# Patient Record
Sex: Female | Born: 1970 | Race: White | Hispanic: Yes | Marital: Married | Smoking: Never smoker
Health system: Southern US, Community
[De-identification: ages and names within clinical notes are randomized; demographics above are authoritative.]

## PROBLEM LIST (undated history)

## (undated) DIAGNOSIS — E039 Hypothyroidism, unspecified: Secondary | ICD-10-CM

## (undated) DIAGNOSIS — R002 Palpitations: Secondary | ICD-10-CM

## (undated) DIAGNOSIS — E0500 Thyrotoxicosis with diffuse goiter without thyrotoxic crisis or storm: Secondary | ICD-10-CM

## (undated) HISTORY — DX: Hypothyroidism, unspecified: E03.9

## (undated) HISTORY — DX: Palpitations: R00.2

## (undated) HISTORY — PX: SEPTOPLASTY: SUR1290

## (undated) HISTORY — DX: Thyrotoxicosis with diffuse goiter without thyrotoxic crisis or storm: E05.00

---

## 2017-10-04 ENCOUNTER — Other Ambulatory Visit: Payer: Self-pay | Admitting: Obstetrics & Gynecology

## 2017-10-04 DIAGNOSIS — Z1231 Encounter for screening mammogram for malignant neoplasm of breast: Secondary | ICD-10-CM

## 2017-10-26 ENCOUNTER — Ambulatory Visit
Admission: RE | Admit: 2017-10-26 | Discharge: 2017-10-26 | Disposition: A | Discharge status: Home / Self Care | Payer: Commercial Managed Care - PPO | Source: Ambulatory Visit | Attending: Obstetrics & Gynecology | Admitting: Obstetrics & Gynecology

## 2017-10-26 ENCOUNTER — Other Ambulatory Visit: Payer: Self-pay | Admitting: Obstetrics & Gynecology

## 2017-10-26 DIAGNOSIS — Z1231 Encounter for screening mammogram for malignant neoplasm of breast: Secondary | ICD-10-CM

## 2018-06-27 DIAGNOSIS — E039 Hypothyroidism, unspecified: Secondary | ICD-10-CM | POA: Diagnosis not present

## 2018-06-27 DIAGNOSIS — R05 Cough: Secondary | ICD-10-CM | POA: Diagnosis not present

## 2018-09-03 DIAGNOSIS — L718 Other rosacea: Secondary | ICD-10-CM | POA: Diagnosis not present

## 2018-10-17 DIAGNOSIS — R42 Dizziness and giddiness: Secondary | ICD-10-CM | POA: Diagnosis not present

## 2019-06-04 ENCOUNTER — Other Ambulatory Visit: Payer: Self-pay

## 2019-06-04 ENCOUNTER — Ambulatory Visit (INDEPENDENT_AMBULATORY_CARE_PROVIDER_SITE_OTHER): Payer: Commercial Managed Care - PPO | Admitting: Family Medicine

## 2019-06-04 ENCOUNTER — Encounter: Payer: Self-pay | Admitting: Family Medicine

## 2019-06-04 DIAGNOSIS — M25522 Pain in left elbow: Secondary | ICD-10-CM

## 2019-06-04 MED ORDER — PREDNISONE 10 MG PO TABS: ORAL_TABLET | ORAL | 0 refills | Status: DC

## 2019-06-04 NOTE — Progress Notes (Signed)
   Office Visit Note   Patient: Yolanda Hall           Date of Birth: Dec 05, 1970           MRN: [id number] Visit Date: 06/04/2019 Requested by: No referring provider defined for this encounter. PCP: Yolanda Pepper, MD  Subjective: Chief Complaint  Patient presents with  . Left Elbow - Pain    Pain x 3 weeks, at least. NKI. Tried TENS unit - helped a little. Pain medial and lateral, especially with certain movements. Right hand dominant.    HPI: She is here with left elbow pain.  She is right-hand dominant.  She cannot recall an injury, but about a month ago she started noticing pain in her elbow in the morning when she woke up.  Pain on the medial and lateral aspect.  Pain gets better after she loosens up.  She has tried a TENS unit, massage, and a variety of stretches and exercises but the pain does not seem to be going away.  Denies any numbness, denies any swelling or erythema.  She has a history of arthritis in her knees, and has Graves' disease and one other autoimmune disorder.  No history of rheumatoid arthritis or gout.  Incidentally, she and her family moved here from [facility] couple years ago.  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              ROS: No fever or chills.  All other systems were reviewed and are negative.  Objective: Vital Signs: There were no vitals taken for this visit.  Physical Exam:  General:  Alert and oriented, in no acute distress. Pulm:  Breathing unlabored. Psy:  Normal mood, congruent affect. Skin: No rash or erythema. Left elbow: Full active range of motion compared to the right, no effusion or warmth.  He is tender at the common extensor tendon at the lateral epicondyle and a little bit on the medial epicondyle.  No tenderness over the ulnar nerve.  Pain with wrist extension and forearm pronation and supination against resistance.  Imaging: None today  Assessment & Plan: 1.  Left elbow pain with medial and lateral  epicondylitis.  Cannot rule out rheumatologic process. -Prednisone taper, continue with home exercises.  If symptoms persist, x-rays and possibly rheumatoid labs.    Procedures: No procedures performed  No notes on file     PMFS History: There are no problems to display for this patient.  History reviewed. No pertinent past medical history.  History reviewed. No pertinent family history.  History reviewed. No pertinent surgical history. Social History   Occupational History  . Not on file  Tobacco Use  . Smoking status: Not on file  Substance and Sexual Activity  . Alcohol use: Not on file  . Drug use: Not on file  . Sexual activity: Not on file

## 2019-06-16 ENCOUNTER — Other Ambulatory Visit: Payer: Self-pay | Admitting: Obstetrics & Gynecology

## 2019-06-16 DIAGNOSIS — Z1231 Encounter for screening mammogram for malignant neoplasm of breast: Secondary | ICD-10-CM

## 2019-06-17 ENCOUNTER — Ambulatory Visit (INDEPENDENT_AMBULATORY_CARE_PROVIDER_SITE_OTHER): Payer: Commercial Managed Care - PPO | Admitting: Family Medicine

## 2019-06-17 ENCOUNTER — Other Ambulatory Visit: Payer: Self-pay

## 2019-06-17 ENCOUNTER — Encounter: Payer: Self-pay | Admitting: Family Medicine

## 2019-06-17 DIAGNOSIS — M25521 Pain in right elbow: Secondary | ICD-10-CM | POA: Diagnosis not present

## 2019-06-17 DIAGNOSIS — M79671 Pain in right foot: Secondary | ICD-10-CM | POA: Diagnosis not present

## 2019-06-17 DIAGNOSIS — M25561 Pain in right knee: Secondary | ICD-10-CM

## 2019-06-17 DIAGNOSIS — R768 Other specified abnormal immunological findings in serum: Secondary | ICD-10-CM

## 2019-06-17 DIAGNOSIS — M25522 Pain in left elbow: Secondary | ICD-10-CM | POA: Diagnosis not present

## 2019-06-17 DIAGNOSIS — M1712 Unilateral primary osteoarthritis, left knee: Secondary | ICD-10-CM

## 2019-06-17 DIAGNOSIS — M25562 Pain in left knee: Secondary | ICD-10-CM

## 2019-06-17 DIAGNOSIS — M79672 Pain in left foot: Secondary | ICD-10-CM

## 2019-06-17 DIAGNOSIS — M1711 Unilateral primary osteoarthritis, right knee: Secondary | ICD-10-CM

## 2019-06-17 NOTE — Progress Notes (Signed)
I saw and examined the patient with Dr. Robby Sermon and agree with assessment and plan as outlined.    Prednisone did not help left elbow, and now both elbows hurt along with both hands, feet and knees.  Will draw arthritis panel labs, possibly referral to Dr. Dierdre Forth depending on results.  Knees are known to have osteoarthritis.  She would like to get approval for bilateral gel injections.

## 2019-06-17 NOTE — Progress Notes (Signed)
Office Visit Note   Patient: Yolanda Hall           Date of Birth: 1970/10/23           MRN: [id number] Visit Date: 06/17/2019 Requested by: London Pepper, MD [address],  [place] [address] PCP: London Pepper, MD  Subjective: Chief Complaint  Patient presents with  . Left Elbow - Pain, Follow-up    Pain is no better post prednisone taper. Having pain in the right elbow as well now.    HPI: She is here for follow up of left elbow pain, now presenting with bilateral elbow pain as well as pain in hands, knees, feet.  Elbows- started with left elbow. When she started taking prednisone on 12/24, she started to have pain in right elbow. Pain on medial and lateral aspect, pain with supination and pronation.   Hands: pain in DIP and PIP joints on both hands. They were very tender when she was taking prednisone. They feel stiff, especially in the cold. Pain gets better after she moves them/loosens up. Occasional swelling. No erythema.  Knees- history of OA in both knees. She has had corticosteroid injections in the past with minimal relief. Feels popping and crepitus. Still running although it is painful.  Pain on the medial and lateral aspect.  Pain gets better after she loosens up.  She has tried a TENS unit, massage, and a variety of stretches and exercises but the pain does not seem to be going away.    Feet- pain in both feet, has been a chronic issue but they have been more painful recently.   Has Graves' disease and one other autoimmune disorder.     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              ROS: No fever or chills.  All other systems were reviewed and are negative.  Objective: Vital Signs: There were no vitals taken for this visit.  Physical Exam:  General:  Alert and oriented, in no acute distress. Pulm:  Breathing unlabored. Psy:  Normal mood, congruent affect. Skin: No rash or erythema. Elbows: Full active range  of motion in both elbows  no effusion or warmth. TTP over medial and lateral epicondyles. Pain with wrist extension and forearm pronation and supination against resistance.  Knees: no effusion. Crepitus and popping bilaterally. TTP medial to patella. No ligamentous laxity.  Hands: no specific swelling of DIP or PIP joints observed. Full ROM in hands and fingers.   Imaging: None today  Assessment & Plan: Given bilateral elbow pain, hand pain and stiffness, along with chronic knee and foot pain, rheumatologic workup warranted. Will order CRP, ESR, ANA, anti- CCP, rheumatoid factor, and vitamin D levels. If any of these are abnormal, will refer to rheumatology. For chronic knee pain with known OA that has not responded to corticosteroid injections, will obtain prior authorization for gel injections bilaterally.    Procedures: None today    PMFS History: There are no problems to display for this patient.  No past medical history on file.  No family history on file.  No past surgical history on file. Social History   Occupational History  . Not on file  Tobacco Use  . Smoking status: Not on file  Substance and Sexual Activity  . Alcohol use: Not on file  . Drug use: Not on file  . Sexual activity: Not on file

## 2019-06-18 NOTE — Progress Notes (Signed)
Noted  

## 2019-06-19 ENCOUNTER — Telehealth: Payer: Self-pay | Admitting: Family Medicine

## 2019-06-19 LAB — SEDIMENTATION RATE: Sed Rate: 2 mm/h (ref 0–20)

## 2019-06-19 LAB — ANTI-NUCLEAR AB-TITER (ANA TITER)
ANA TITER: 1:80 {titer} — ABNORMAL HIGH
ANA Titer 1: 1:80 {titer} — ABNORMAL HIGH

## 2019-06-19 LAB — ANA: Anti Nuclear Antibody (ANA): POSITIVE — AB

## 2019-06-19 LAB — C-REACTIVE PROTEIN: CRP: 0.6 mg/L (ref <8.0)

## 2019-06-19 LAB — VITAMIN D 25 HYDROXY (VIT D DEFICIENCY, FRACTURES): Vit D, 25-Hydroxy: 24 ng/mL — ABNORMAL LOW (ref 30–100)

## 2019-06-19 LAB — CYCLIC CITRUL PEPTIDE ANTIBODY, IGG: Cyclic Citrullin Peptide Ab: <16 UNITS

## 2019-06-19 LAB — RHEUMATOID FACTOR: Rheumatoid fact SerPl-aCnc: <14 IU/mL (ref <14)

## 2019-06-19 NOTE — Telephone Encounter (Signed)
Labs are notable for the following:  ANA is positive with a fairly low titer of 1: 80.  This is due to autoimmune disease of some sort, possibly Graves' disease but potentially something else.  Vitamin D is low at 24.  We want this to be 50-80.  I recommend taking vitamin D3 at 5000 IU daily long-term.  Other results were normal/negative.

## 2019-06-20 ENCOUNTER — Encounter: Payer: Self-pay | Admitting: Family Medicine

## 2019-06-20 ENCOUNTER — Ambulatory Visit
Admission: RE | Admit: 2019-06-20 | Discharge: 2019-06-20 | Disposition: A | Discharge status: Home / Self Care | Payer: Commercial Managed Care - PPO | Source: Ambulatory Visit | Attending: Obstetrics & Gynecology | Admitting: Obstetrics & Gynecology

## 2019-06-20 ENCOUNTER — Other Ambulatory Visit: Payer: Self-pay

## 2019-06-20 ENCOUNTER — Other Ambulatory Visit: Payer: Self-pay | Admitting: Obstetrics & Gynecology

## 2019-06-20 DIAGNOSIS — Z1231 Encounter for screening mammogram for malignant neoplasm of breast: Secondary | ICD-10-CM

## 2019-06-20 NOTE — Addendum Note (Signed)
Addended by: Lillia Carmel on: 06/20/2019 07:55 AM   Modules accepted: Orders

## 2019-06-24 ENCOUNTER — Telehealth: Payer: Self-pay

## 2019-06-24 NOTE — Telephone Encounter (Signed)
Submitted VOB for Durolane, bilateral knee. °

## 2019-06-26 ENCOUNTER — Encounter: Payer: Self-pay | Admitting: Family Medicine

## 2019-06-26 ENCOUNTER — Telehealth: Payer: Self-pay

## 2019-06-26 NOTE — Telephone Encounter (Signed)
Talked with patient concerning correct policy# for [facility] insurance. Talked with Greggory Stallion with [phone] and provided him with the correct policy# and phone number.

## 2019-07-01 ENCOUNTER — Telehealth: Payer: Self-pay

## 2019-07-01 NOTE — Telephone Encounter (Signed)
Patient aware that she is approved for gel injection.  Approved for Durolane, bilateral knee. Buy & Bill Covered at 100% of the allowable amount Co-pay of $50.00 No PA required  Appt. 07/04/2019 with Dr. Prince Rome

## 2019-07-04 ENCOUNTER — Ambulatory Visit: Payer: Commercial Managed Care - PPO | Admitting: Family Medicine

## 2019-07-04 ENCOUNTER — Ambulatory Visit: Payer: Self-pay

## 2019-07-04 ENCOUNTER — Encounter: Payer: Self-pay | Admitting: Family Medicine

## 2019-07-04 ENCOUNTER — Other Ambulatory Visit: Payer: Self-pay

## 2019-07-04 DIAGNOSIS — M1711 Unilateral primary osteoarthritis, right knee: Secondary | ICD-10-CM | POA: Diagnosis not present

## 2019-07-04 DIAGNOSIS — M1712 Unilateral primary osteoarthritis, left knee: Secondary | ICD-10-CM

## 2019-07-04 NOTE — Progress Notes (Signed)
   Office Visit Note   Patient: Auset Fritzler           Date of Birth: July 24, 1970           MRN: [id number] Visit Date: 07/04/2019 Requested by: Farris Has, MD [address] [place],  [place] [address] PCP: Farris Has, MD  Subjective: Chief Complaint  Patient presents with  . bilateral knee Durolane injections    HPI: She is here for planned bilateral Durolane injections for knee osteoarthritis.  She is scheduled to meet with rheumatologist next week.  Her elbow is still bothering her quite a bit.              ROS: No fevers or chills.  All other systems were reviewed and are negative.  Objective: Vital Signs: There were no vitals taken for this visit.  Physical Exam:  General:  Alert and oriented, in no acute distress. Pulm:  Breathing unlabored. Psy:  Normal mood, congruent affect. Skin: No erythema or rash. Knees: Trace effusion bilaterally with no warmth.  Imaging: None other than for needle guidance  Assessment & Plan: 1.  Bilateral knee osteoarthritis -Ultrasound-guided Durolane injections today.  Follow-up as needed.     Procedures: Bilateral knee injections: Ultrasound was used to ensure intra-articular placement.  After sterile prep with Betadine, injected 3 cc 1% lidocaine without epinephrine and durolane from lateral approach into the right knee and medial approach into the left knee guiding the needle into the joint recess.  No immediate complications.    PMFS History: There are no problems to display for this patient.  History reviewed. No pertinent past medical history.  History reviewed. No pertinent family history.  History reviewed. No pertinent surgical history. Social History   Occupational History  . Not on file  Tobacco Use  . Smoking status: Not on file  Substance and Sexual Activity  . Alcohol use: Not on file  . Drug use: Not on file  . Sexual activity: Not on file

## 2019-09-17 ENCOUNTER — Telehealth: Payer: Self-pay | Admitting: Internal Medicine

## 2019-09-17 NOTE — Telephone Encounter (Signed)
ROI faxed to Dr. Morrow/[facility] 4/7/21fbg  

## 2019-09-25 ENCOUNTER — Other Ambulatory Visit: Payer: Self-pay

## 2019-09-26 ENCOUNTER — Encounter: Payer: Self-pay | Admitting: Internal Medicine

## 2019-09-26 ENCOUNTER — Ambulatory Visit: Payer: Commercial Managed Care - PPO | Admitting: Internal Medicine

## 2019-09-26 DIAGNOSIS — E0500 Thyrotoxicosis with diffuse goiter without thyrotoxic crisis or storm: Secondary | ICD-10-CM | POA: Insufficient documentation

## 2019-09-26 DIAGNOSIS — E039 Hypothyroidism, unspecified: Secondary | ICD-10-CM | POA: Insufficient documentation

## 2019-09-26 DIAGNOSIS — E890 Postprocedural hypothyroidism: Secondary | ICD-10-CM | POA: Diagnosis not present

## 2019-09-26 NOTE — Patient Instructions (Signed)
-  Nice seeing you today!!  -Schedule follow up in early fall for your physical. Please come in fasting for lab work.

## 2019-09-26 NOTE — Progress Notes (Signed)
New Patient Office Visit     This visit occurred during the SARS-CoV-2 public health emergency.  Safety protocols were in place, including screening questions prior to the visit, additional usage of staff PPE, and extensive cleaning of exam room while observing appropriate contact time as indicated for disinfecting solutions.    CC/Reason for Visit: Establish care, discuss chronic medical conditions Previous PCP: Farris Has, MD with [facility] Last Visit: Unclear  HPI: Yolanda Hall is a 49 y.o. female who is coming in today for the above mentioned reasons. Past Medical History is significant for: Hypothyroidism on Armour Thyroid followed by [facility] this is following radioactive iodine ablation for Graves' disease.  She also has a history of dermatitis purpura not currently on active treatment.  She has no complaints today.  She is a Advice worker.  She moved to [place] in 2015.  She is married, has a daughter who is in the fifth grade, has no known drug allergies, her past surgical history significant for C-section in 2010 and a deviated septum repair in 2019, she does not smoke, she does not drink, she does not have any family history of significance.   Past Medical/Surgical History: Past Medical History:  Diagnosis Date  . Graves disease   . Hypothyroidism     Past Surgical History:  Procedure Laterality Date  . CESAREAN SECTION    . SEPTOPLASTY      Social History:  reports that she has never smoked. She has never used smokeless tobacco. She reports that she does not drink alcohol or use drugs.  Allergies: No Known Allergies  Family History:  History of heart disease, cancer, stroke that she is  Current Outpatient Medications:  .  ARMOUR THYROID 90 MG tablet, Take 90 mg by mouth daily., Disp: , Rfl:  .  fexofenadine (ALLEGRA) 180 MG tablet, Take 180 mg by mouth daily., Disp: , Rfl:  .  Multiple Vitamin (MULTIVITAMIN) tablet, Take 1 tablet by  mouth daily., Disp: , Rfl:  .  triamcinolone (NASACORT) 55 MCG/ACT AERO nasal inhaler, Place 2 sprays into the nose daily., Disp: , Rfl:   Review of Systems:  Constitutional: Denies fever, chills, diaphoresis, appetite change and fatigue.  HEENT: Denies photophobia, eye pain, redness, hearing loss, ear pain, congestion, sore throat, rhinorrhea, sneezing, mouth sores, trouble swallowing, neck pain, neck stiffness and tinnitus.   Respiratory: Denies SOB, DOE, cough, chest tightness,  and wheezing.   Cardiovascular: Denies chest pain, palpitations and leg swelling.  Gastrointestinal: Denies nausea, vomiting, abdominal pain, diarrhea, constipation, blood in stool and abdominal distention.  Genitourinary: Denies dysuria, urgency, frequency, hematuria, flank pain and difficulty urinating.  Endocrine: Denies: hot or cold intolerance, sweats, changes in hair or nails, polyuria, polydipsia. Musculoskeletal: Denies myalgias, back pain, joint swelling, arthralgias and gait problem.  Skin: Denies pallor, rash and wound.  Neurological: Denies dizziness, seizures, syncope, weakness, light-headedness, numbness and headaches.  Hematological: Denies adenopathy. Easy bruising, personal or family bleeding history  Psychiatric/Behavioral: Denies suicidal ideation, mood changes, confusion, nervousness, sleep disturbance and agitation    Physical Exam: Vitals:   09/26/19 0838  BP: 120/84  Pulse: 76  Temp: 97.8 F (36.6 C)  TempSrc: Temporal  SpO2: 95%  Weight: 160 lb 12.8 oz (72.9 kg)  Height: 5\' 5"  (1.651 m)   Body mass index is 26.76 kg/m.  Constitutional: NAD, calm, comfortable Eyes: PERRL, lids and conjunctivae normal ENMT: Mucous membranes are moist.  Respiratory: clear to auscultation bilaterally, no wheezing, no  crackles. Normal respiratory effort. No accessory muscle use.  Cardiovascular: Regular rate and rhythm, no murmurs / rubs / gallops. No extremity edema.  Neurologic: Grossly intact  and nonfocal Psychiatric: Normal judgment and insight. Alert and oriented x 3. Normal mood.    Impression and Plan:  Graves disease Postablative hypothyroidism -She is status post radioactive iodine ablation with iatrogenic hypothyroidism and now on Armour Thyroid. -She is followed for this by [facility] MD. -At last check her TSH was over suppressed so they are in the process of adjusting her dosage.  She will return early fall for her physical.    Patient Instructions  -Nice seeing you today!!  -Schedule follow up in early fall for your physical. Please come in fasting for lab work.      Lelon Frohlich, MD [facility]

## 2020-01-30 ENCOUNTER — Ambulatory Visit: Payer: Commercial Managed Care - PPO | Admitting: Family Medicine

## 2020-01-30 ENCOUNTER — Other Ambulatory Visit: Payer: Self-pay

## 2020-01-30 ENCOUNTER — Encounter: Payer: Self-pay | Admitting: Family Medicine

## 2020-01-30 VITALS — BP 124/60 | HR 105 | Temp 97.9°F | Wt 163.2 lb

## 2020-01-30 DIAGNOSIS — R300 Dysuria: Secondary | ICD-10-CM

## 2020-01-30 LAB — POCT URINALYSIS DIPSTICK
Bilirubin, UA: NEGATIVE
Blood, UA: NEGATIVE
Glucose, UA: NEGATIVE
Ketones, UA: POSITIVE
Leukocytes, UA: NEGATIVE
Nitrite, UA: NEGATIVE
Protein, UA: NEGATIVE
Spec Grav, UA: 1.02 (ref 1.010–1.025)
Urobilinogen, UA: 0.2 E.U./dL
pH, UA: 6 (ref 5.0–8.0)

## 2020-01-30 MED ORDER — SULFAMETHOXAZOLE-TRIMETHOPRIM 800-160 MG PO TABS: 1.0000 | ORAL_TABLET | Freq: Two times a day (BID) | ORAL | 0 refills | Status: DC

## 2020-01-30 MED ORDER — PHENAZOPYRIDINE HCL 200 MG PO TABS: 200.0000 mg | ORAL_TABLET | Freq: Three times a day (TID) | ORAL | 0 refills | Status: DC | PRN

## 2020-01-30 NOTE — Progress Notes (Signed)
   Subjective:    Patient ID: Yolanda Hall, female    DOB: [birth year], 49 y.o.   MRN: [id number]  HPI Here for UTI symptoms that have not responded to 2 antibiotics. Several weeks ago she began to have lower back pains, lower abdominal pains, urgency to urinate, and burning on urination. No nausea or fever. No vaginal DC. BMs have been normal. On 01-19-20 she was in [place] visiting family and she saw a medical clinic there for these symptoms. A urine culture grew 10,000-49,000 colonies of Enterococcus faecalis which was pan-sensitive. She was given 5 days of Cipro, but she did not improve. She was then given 5 days of Macrobid, but she still did not improve. Today she is having the same symptoms. She is drinking lots of water and some cranberry juice.    Review of Systems  Constitutional: Negative.   Respiratory: Negative.   Cardiovascular: Negative.   Gastrointestinal: Negative.   Genitourinary: Positive for dysuria, frequency and urgency. Negative for flank pain, hematuria, vaginal bleeding and vaginal discharge.       Objective:   Physical Exam Constitutional:      Appearance: Normal appearance. She is not ill-appearing.  Cardiovascular:     Rate and Rhythm: Normal rate and regular rhythm.     Pulses: Normal pulses.  Pulmonary:     Effort: Pulmonary effort is normal.     Breath sounds: Normal breath sounds.  Abdominal:     General: Abdomen is flat. Bowel sounds are normal. There is no distension.     Palpations: Abdomen is soft. There is no mass.     Tenderness: There is no abdominal tenderness. There is no right CVA tenderness, left CVA tenderness, guarding or rebound.     Hernia: No hernia is present.  Neurological:     Mental Status: She is alert.           Assessment & Plan:  Dysuria. The fact that she has not responded to 2 courses of antibiotics makes me wonder if something else may be going on, such as interstitial cystitis. We will give her 7 days of Bactrim  and add Pyridium for comfort. Refer to Urology. Gershon Crane, MD

## 2020-02-01 LAB — URINE CULTURE
MICRO NUMBER:: 10853079
SPECIMEN QUALITY:: ADEQUATE

## 2020-07-02 ENCOUNTER — Other Ambulatory Visit: Payer: Self-pay

## 2020-07-02 ENCOUNTER — Ambulatory Visit: Payer: Commercial Managed Care - PPO | Admitting: Adult Health

## 2020-07-02 ENCOUNTER — Encounter: Payer: Self-pay | Admitting: Adult Health

## 2020-07-02 VITALS — BP 118/82 | Temp 98.1°F | Wt 166.0 lb

## 2020-07-02 DIAGNOSIS — L03012 Cellulitis of left finger: Secondary | ICD-10-CM | POA: Diagnosis not present

## 2020-07-02 MED ORDER — DOXYCYCLINE HYCLATE 100 MG PO CAPS: 100.0000 mg | ORAL_CAPSULE | Freq: Two times a day (BID) | ORAL | 0 refills | Status: DC

## 2020-07-02 NOTE — Progress Notes (Signed)
Subjective:    Patient ID: Yolanda Hall, female    DOB: 06/29/1970, 50 y.o.   MRN: [id number]  HPI  50 year old female who  has a past medical history of Graves disease and Hypothyroidism.  She presents to the office today for an acute issue of concern for finger infection. Reports that she went to the nail salon about a week ago and " they were scrapping the acrylic off and they went to deep into my nail bed. Reports pain and swelling at the site of the nail bed on her left ring finger. Denies drainage. Would like nail removed    Review of Systems See HPI   Past Medical History:  Diagnosis Date  . Graves disease   . Hypothyroidism     Social History   Socioeconomic History  . Marital status: Married    Spouse name: Not on file  . Number of children: Not on file  . Years of education: Not on file  . Highest education level: Not on file  Occupational History  . Not on file  Tobacco Use  . Smoking status: Never Smoker  . Smokeless tobacco: Never Used  Substance and Sexual Activity  . Alcohol use: Never  . Drug use: Never  . Sexual activity: Not on file  Other Topics Concern  . Not on file  Social History Narrative  . Not on file   Social Determinants of Health   Financial Resource Strain: Not on file  Food Insecurity: Not on file  Transportation Needs: Not on file  Physical Activity: Not on file  Stress: Not on file  Social Connections: Not on file  Intimate Partner Violence: Not on file    Past Surgical History:  Procedure Laterality Date  . CESAREAN SECTION    . SEPTOPLASTY      History reviewed. No pertinent family history.  No Known Allergies  Current Outpatient Medications on File Prior to Visit  Medication Sig Dispense Refill  . fexofenadine (ALLEGRA) 180 MG tablet Take 180 mg by mouth daily.    . Multiple Vitamin (MULTIVITAMIN) tablet Take 1 tablet by mouth daily.    Marland Kitchen thyroid (ARMOUR THYROID) 15 MG tablet Take 15 mg by mouth daily.     Marland Kitchen thyroid (ARMOUR THYROID) 60 MG tablet Take 60 mg by mouth daily before breakfast.    . triamcinolone (NASACORT) 55 MCG/ACT AERO nasal inhaler Place 2 sprays into the nose daily.     No current facility-administered medications on file prior to visit.    BP 118/82   Temp 98.1 F (36.7 C)   Wt 166 lb (75.3 kg)   BMI 27.62 kg/m       Objective:   Physical Exam Vitals and nursing note reviewed.  Constitutional:      Appearance: Normal appearance.  Skin:    General: Skin is warm and dry.     Capillary Refill: Capillary refill takes less than 2 seconds.     Findings: Erythema present.     Comments: Paronychia at base of nail bed on left ring finger   Neurological:     General: No focal deficit present.     Mental Status: She is alert and oriented to person, place, and time.  Psychiatric:        Mood and Affect: Mood normal.        Behavior: Behavior normal.        Thought Content: Thought content normal.  Judgment: Judgment normal.       Assessment & Plan:  1. Paronychia of finger, right - No indication for nail removal. Will place on doxycycline.  - Follow up with PCP if not resolved by the end of antibiotic therapy  - Tylenol/Motrin for pain relief.  - doxycycline (VIBRAMYCIN) 100 MG capsule; Take 1 capsule (100 mg total) by mouth 2 (two) times daily.  Dispense: 14 capsule; Refill: 0   Shirline Frees, NP

## 2020-11-15 ENCOUNTER — Other Ambulatory Visit: Payer: Self-pay

## 2020-11-15 ENCOUNTER — Encounter: Payer: Self-pay | Admitting: Family Medicine

## 2020-11-15 ENCOUNTER — Ambulatory Visit: Payer: Commercial Managed Care - PPO | Admitting: Family Medicine

## 2020-11-15 VITALS — BP 138/90 | HR 94 | Temp 97.7°F | Wt 167.6 lb

## 2020-11-15 DIAGNOSIS — R002 Palpitations: Secondary | ICD-10-CM | POA: Diagnosis not present

## 2020-11-15 DIAGNOSIS — E890 Postprocedural hypothyroidism: Secondary | ICD-10-CM

## 2020-11-15 NOTE — Patient Instructions (Signed)

## 2020-11-15 NOTE — Progress Notes (Signed)
Established Patient Office Visit  Subjective:  Patient ID: Yolanda Hall, female    DOB: 1970-11-09  Age: 50 y.o. MRN: [id number]  CC:  Chief Complaint  Patient presents with  . Palpitations    Felt palpitations yesterday and today, SOB, light headed, pt states she just feels off    HPI Dmya Long presents for recent palpitation symptoms.  She noticed some intermittent sense of irregular heartbeat yesterday and then again today when she was at the computer working.  This was not tachycardia but occasional prominent beat.  No recent chest pains.  No exercise intolerance.  No dyspnea.  No syncope.  No dizziness.  She does drink about 2 cups of coffee per day and frequently does a preworkout caffeine but no changes in caffeine intake.  No alcohol use.  She does have history of Graves' disease and is on thyroid replacement now and gets her thyroid blood test monitored elsewhere and states these have been normal recently.  Past Medical History:  Diagnosis Date  . Graves disease   . Hypothyroidism     Past Surgical History:  Procedure Laterality Date  . CESAREAN SECTION    . SEPTOPLASTY      No family history on file.  Social History   Socioeconomic History  . Marital status: Married    Spouse name: Not on file  . Number of children: Not on file  . Years of education: Not on file  . Highest education level: Not on file  Occupational History  . Not on file  Tobacco Use  . Smoking status: Never Smoker  . Smokeless tobacco: Never Used  Substance and Sexual Activity  . Alcohol use: Never  . Drug use: Never  . Sexual activity: Not on file  Other Topics Concern  . Not on file  Social History Narrative  . Not on file   Social Determinants of Health   Financial Resource Strain: Not on file  Food Insecurity: Not on file  Transportation Needs: Not on file  Physical Activity: Not on file  Stress: Not on file  Social Connections: Not on file  Intimate  Partner Violence: Not on file    Outpatient Medications Prior to Visit  Medication Sig Dispense Refill  . doxycycline (VIBRAMYCIN) 100 MG capsule Take 1 capsule (100 mg total) by mouth 2 (two) times daily. 14 capsule 0  . fexofenadine (ALLEGRA) 180 MG tablet Take 180 mg by mouth daily.    . Multiple Vitamin (MULTIVITAMIN) tablet Take 1 tablet by mouth daily.    Marland Kitchen thyroid (ARMOUR) 15 MG tablet Take 15 mg by mouth daily.    Marland Kitchen thyroid (ARMOUR) 60 MG tablet Take 60 mg by mouth daily before breakfast.    . triamcinolone (NASACORT) 55 MCG/ACT AERO nasal inhaler Place 2 sprays into the nose daily.     No facility-administered medications prior to visit.    No Known Allergies  ROS Review of Systems  Constitutional: Negative for chills and unexpected weight change.  Respiratory: Negative for shortness of breath.   Cardiovascular: Positive for palpitations. Negative for chest pain.  Genitourinary: Negative for dysuria.  Neurological: Negative for dizziness, syncope and weakness.      Objective:    Physical Exam Vitals reviewed.  Constitutional:      Appearance: Normal appearance.  Cardiovascular:     Rate and Rhythm: Normal rate and regular rhythm.     Heart sounds: No murmur heard. No gallop.   Pulmonary:  Effort: Pulmonary effort is normal.     Breath sounds: Normal breath sounds.  Neurological:     Mental Status: She is alert.     BP 138/90 (BP Location: Left Arm, Cuff Size: Normal)   Pulse 94   Temp 97.7 F (36.5 C) (Oral)   Wt 167 lb 9.6 oz (76 kg)   SpO2 97%   BMI 27.89 kg/m  Wt Readings from Last 3 Encounters:  11/15/20 167 lb 9.6 oz (76 kg)  07/02/20 166 lb (75.3 kg)  01/30/20 163 lb 3.2 oz (74 kg)     Health Maintenance Due  Topic Date Due  . HIV Screening  Never done  . Hepatitis C Screening  Never done  . PAP SMEAR-Modifier  Never done  . COLONOSCOPY (Pts 45-96yr Insurance coverage will need to be confirmed)  Never done  . Zoster Vaccines-  Shingrix (1 of 2) Never done    There are no preventive care reminders to display for this patient.  No results found for: TSH No results found for: WBC, HGB, HCT, MCV, PLT No results found for: NA, K, CHLORIDE, CO2, GLUCOSE, BUN, CREATININE, BILITOT, ALKPHOS, AST, ALT, PROT, ALBUMIN, CALCIUM, ANIONGAP, EGFR, GFR No results found for: CHOL No results found for: HDL No results found for: LDLCALC No results found for: TRIG No results found for: CHOLHDL No results found for: HGBA1C    Assessment & Plan:   Intermittent palpitations of 2 days duration.  Sounds like probable PVCs or PACs.  She denies any worrisome symptoms such as syncope or presyncopal symptoms-although she states her symptoms are fairly intense and very prominent.    -Check EKG-this shows normal sinus rhythm.  No acute ST-T changes. -Discussed possible triggers for palpitations such as PVCs such as caffeine, alcohol, chocolate -Gradually reduce caffeine intake -Consider cardiac event monitor  No orders of the defined types were placed in this encounter.   Follow-up: No follow-ups on file.    BCarolann Littler MD

## 2020-11-16 ENCOUNTER — Ambulatory Visit: Payer: Commercial Managed Care - PPO | Admitting: Internal Medicine

## 2020-11-17 ENCOUNTER — Telehealth: Payer: Self-pay | Admitting: Internal Medicine

## 2020-11-17 DIAGNOSIS — R7989 Other specified abnormal findings of blood chemistry: Secondary | ICD-10-CM

## 2020-11-17 NOTE — Telephone Encounter (Signed)
Pt is calling in stating that she need a referral to [facility] w/Dr. Everardo All for her TSH. [phone] [id number] (P) [phone] [id number] (F)

## 2020-11-18 ENCOUNTER — Ambulatory Visit (INDEPENDENT_AMBULATORY_CARE_PROVIDER_SITE_OTHER): Payer: Commercial Managed Care - PPO

## 2020-11-18 DIAGNOSIS — R002 Palpitations: Secondary | ICD-10-CM

## 2020-11-18 NOTE — Addendum Note (Signed)
Addended by: Christy Sartorius on: 11/18/2020 07:56 AM   Modules accepted: Orders

## 2020-11-18 NOTE — Telephone Encounter (Signed)
Referral to endocrinology has been placed and patient is aware.

## 2020-11-19 ENCOUNTER — Telehealth: Payer: Self-pay | Admitting: *Deleted

## 2020-11-19 NOTE — Telephone Encounter (Signed)
Patient is requesting labs: Vit D level and TSH.  Would you like an office visit or just labs?

## 2020-11-22 NOTE — Telephone Encounter (Signed)
Patient called requesting to have labs done for her thyroid. I advised her that there are no orders placed for the lab and I would have to message Dr. Ardyth Harps and she stated that she last seen Dr. Caryl Never on 11/15/2020 and I advised her that Dr. Ardyth Harps is her PCP and she would have to be the one to place the orders and it might require an office visit and she said Okay and hung up the phone.  Please advise

## 2020-11-24 ENCOUNTER — Emergency Department (HOSPITAL_BASED_OUTPATIENT_CLINIC_OR_DEPARTMENT_OTHER)
Admission: EM | Admit: 2020-11-24 | Discharge: 2020-11-24 | Disposition: A | Discharge status: Home / Self Care | Payer: Commercial Managed Care - PPO | Attending: Emergency Medicine | Admitting: Emergency Medicine

## 2020-11-24 ENCOUNTER — Emergency Department (HOSPITAL_BASED_OUTPATIENT_CLINIC_OR_DEPARTMENT_OTHER): Payer: Commercial Managed Care - PPO | Admitting: Radiology

## 2020-11-24 ENCOUNTER — Encounter (HOSPITAL_BASED_OUTPATIENT_CLINIC_OR_DEPARTMENT_OTHER): Payer: Self-pay

## 2020-11-24 ENCOUNTER — Other Ambulatory Visit: Payer: Self-pay

## 2020-11-24 DIAGNOSIS — E039 Hypothyroidism, unspecified: Secondary | ICD-10-CM | POA: Diagnosis not present

## 2020-11-24 DIAGNOSIS — R002 Palpitations: Secondary | ICD-10-CM | POA: Diagnosis not present

## 2020-11-24 DIAGNOSIS — R0789 Other chest pain: Secondary | ICD-10-CM | POA: Diagnosis not present

## 2020-11-24 DIAGNOSIS — Z79899 Other long term (current) drug therapy: Secondary | ICD-10-CM | POA: Insufficient documentation

## 2020-11-24 LAB — BASIC METABOLIC PANEL
Anion gap: 5 (ref 5–15)
BUN: 15 mg/dL (ref 6–20)
CO2: 30 mmol/L (ref 22–32)
Calcium: 10 mg/dL (ref 8.9–10.3)
Chloride: 105 mmol/L (ref 98–111)
Creatinine, Ser: 0.66 mg/dL (ref 0.44–1.00)
GFR, Estimated: >60 mL/min (ref >60)
Glucose, Bld: 88 mg/dL (ref 70–99)
Potassium: 4.8 mmol/L (ref 3.5–5.1)
Sodium: 140 mmol/L (ref 135–145)

## 2020-11-24 LAB — CBC
HCT: 44.2 % (ref 36.0–46.0)
Hemoglobin: 14.6 g/dL (ref 12.0–15.0)
MCH: 30.9 pg (ref 26.0–34.0)
MCHC: 33 g/dL (ref 30.0–36.0)
MCV: 93.4 fL (ref 80.0–100.0)
Platelets: 349 10*3/uL (ref 150–400)
RBC: 4.73 MIL/uL (ref 3.87–5.11)
RDW: 12.4 % (ref 11.5–15.5)
WBC: 5.6 10*3/uL (ref 4.0–10.5)
nRBC: 0 % (ref 0.0–0.2)

## 2020-11-24 LAB — TROPONIN I (HIGH SENSITIVITY): Troponin I (High Sensitivity): 2 ng/L (ref <18)

## 2020-11-24 NOTE — Discharge Instructions (Addendum)
Follow-up with your local doctors as discussed. Return for new or worsening symptoms.

## 2020-11-24 NOTE — ED Provider Notes (Signed)
[facility] EMERGENCY DEPT Provider Note   CSN: [id number] Arrival date & time: 11/24/20  1111     History Chief Complaint  Patient presents with   Palpitations    Yolanda Hall is a 50 y.o. female.  Patient presents with more frequent heart fluttering or palpitations over the past few days with intermittent chest tightness worse with lying flat.  Patient has no heart attack, blood clot, heart failure history.  Patient had Graves' disease, thyroid storm and different challenges with thyroid hormones.  Recently had thyroid levels drawn but results will be back tomorrow when she has an appointment.  Patient had Holter monitor past 72 hours unsure results.  Currently no active chest pain.  No fevers chills or cough.  No syncope.  Nonexertional.      Past Medical History:  Diagnosis Date   Graves disease    Hypothyroidism     Patient Active Problem List   Diagnosis Date Noted   Graves disease    Hypothyroidism     Past Surgical History:  Procedure Laterality Date   CESAREAN SECTION     SEPTOPLASTY       OB History   No obstetric history on file.     No family history on file.  Social History   Tobacco Use   Smoking status: Never   Smokeless tobacco: Never  Substance Use Topics   Alcohol use: Never   Drug use: Never    Home Medications Prior to Admission medications   Medication Sig Start Date End Date Taking? Authorizing Provider  fexofenadine (ALLEGRA) 180 MG tablet Take 180 mg by mouth daily.   Yes [provider]  Multiple Vitamin (MULTIVITAMIN) tablet Take 1 tablet by mouth daily.   Yes [provider]  thyroid (ARMOUR) 15 MG tablet Take 15 mg by mouth daily.   Yes [provider]  thyroid (ARMOUR) 60 MG tablet Take 60 mg by mouth daily before breakfast.   Yes [provider]  triamcinolone (NASACORT) 55 MCG/ACT AERO nasal inhaler Place 2 sprays into the nose daily.   Yes [provider]   doxycycline (VIBRAMYCIN) 100 MG capsule Take 1 capsule (100 mg total) by mouth 2 (two) times daily. Patient not taking: Reported on 11/24/2020 07/02/20   Shirline Frees, NP    Allergies    Patient has no known allergies.  Review of Systems   Review of Systems  Constitutional:  Negative for chills and fever.  HENT:  Negative for congestion.   Eyes:  Negative for visual disturbance.  Respiratory:  Positive for chest tightness and shortness of breath.   Cardiovascular:  Negative for chest pain and leg swelling.  Gastrointestinal:  Negative for abdominal pain and vomiting.  Genitourinary:  Negative for dysuria and flank pain.  Musculoskeletal:  Negative for back pain, neck pain and neck stiffness.  Skin:  Negative for rash.  Neurological:  Negative for light-headedness and headaches.   Physical Exam Updated Vital Signs BP 128/79   Pulse 64   Resp 17   Ht 5\' 5"  (1.651 m)   Wt 74.8 kg   SpO2 100%   BMI 27.46 kg/m   Physical Exam Vitals and nursing note reviewed.  Constitutional:      General: She is not in acute distress.    Appearance: She is well-developed.  HENT:     Head: Normocephalic and atraumatic.     Mouth/Throat:     Mouth: Mucous membranes are dry.  Eyes:  General:        Right eye: No discharge.        Left eye: No discharge.     Conjunctiva/sclera: Conjunctivae normal.  Neck:     Trachea: No tracheal deviation.  Cardiovascular:     Rate and Rhythm: Normal rate and regular rhythm.     Heart sounds: No murmur heard. Pulmonary:     Effort: Pulmonary effort is normal.     Breath sounds: Normal breath sounds.  Abdominal:     General: There is no distension.     Palpations: Abdomen is soft.     Tenderness: There is no abdominal tenderness. There is no guarding.  Musculoskeletal:        General: No swelling or tenderness.     Cervical back: Normal range of motion and neck supple. No rigidity.  Skin:    General: Skin is warm.     Capillary Refill:  Capillary refill takes less than 2 seconds.     Findings: No rash.  Neurological:     General: No focal deficit present.     Mental Status: She is alert.     Cranial Nerves: No cranial nerve deficit.  Psychiatric:        Mood and Affect: Mood normal.    ED Results / Procedures / Treatments   Labs (all labs ordered are listed, but only abnormal results are displayed) Labs Reviewed  BASIC METABOLIC PANEL  CBC  PREGNANCY, URINE  TROPONIN I (HIGH SENSITIVITY)  TROPONIN I (HIGH SENSITIVITY)    EKG EKG Interpretation  Date/Time:  Wednesday November 24 2020 11:29:26 EDT Ventricular Rate:  67 PR Interval:  178 QRS Duration: 64 QT Interval:  368 QTC Calculation: 388 R Axis:   -73 Text Interpretation: Normal sinus rhythm Left axis deviation Abnormal ECG Confirmed by Blane Ohara [phone]) on 11/24/2020 1:31:11 PM  Radiology DG Chest 2 View  Result Date: 11/24/2020 CLINICAL DATA:  Cardiac palpitations EXAM: CHEST - 2 VIEW COMPARISON:  None. FINDINGS: Lungs are clear. Heart size and pulmonary vascularity are normal. No adenopathy. There is midthoracic dextroscoliosis. IMPRESSION: Lungs clear.  Heart size normal. Electronically Signed   By: Bretta Bang III M.D.   On: 11/24/2020 13:01    Procedures Ultrasound ED Echo  Date/Time: 11/24/2020 2:42 PM Performed by: Blane Ohara, MD Authorized by: Blane Ohara, MD   Procedure details:    Indications: chest pain     Views: parasternal long axis view and parasternal short axis view     Images: archived     Limitations:  Positioning Findings:    Pericardium: no pericardial effusion     LV Function: normal (>50% EF)   Impression:    Impression: normal     Medications Ordered in ED Medications - No data to display  ED Course  I have reviewed the triage vital signs and the nursing notes.  Pertinent labs & imaging results that were available during my care of the patient were reviewed by me and considered in my medical  decision making (see chart for details).    MDM Rules/Calculators/A&P                          Patient with thyroid history presents with intermittent palpitations and mild chest tightness.  Currently no active chest pain.  Blood work reviewed and results are normal electrolytes, normal hemoglobin no signs of anemia to explain symptoms, normal kidney function.  Troponin negative.  No signs  of heart attack, EKG no acute ischemic changes.  Chest x-ray no acute findings reviewed results.  Bedside ultrasound no pericardial effusion, normal systolic ejection fraction. Patient stable for outpatient follow-up with primary doctor and local cardiologist.   Final Clinical Impression(s) / ED Diagnoses Final diagnoses:  Palpitations    Rx / DC Orders ED Discharge Orders     None        Blane Ohara, MD 11/24/20 1442

## 2020-11-24 NOTE — ED Triage Notes (Signed)
Pt reports feeling heart "flutter", has history of hypothyroidism. Reports shortness of breath and tightness in  chest today, having difficulty lying flat.

## 2020-11-25 NOTE — Telephone Encounter (Signed)
Spoke with the pt, informed her of the message below and scheduled an appt for 01/12/2021.

## 2020-11-25 NOTE — Telephone Encounter (Signed)
Left a message for the pt to return my call.  

## 2021-01-12 ENCOUNTER — Ambulatory Visit: Payer: Commercial Managed Care - PPO | Admitting: Internal Medicine

## 2021-02-08 NOTE — Progress Notes (Signed)
Name: Yolanda Hall  MRN/ DOB: [id number], 02/22/1971    Age/ Sex: 50 y.o., female    PCP: Philip Aspen, Limmie Patricia, MD   Reason for Endocrinology Evaluation: Hypothyroidism     Date of Initial Endocrinology Evaluation: 02/09/2021     HPI: Ms. Yolanda Hall is a 50 y.o. female with a past medical history of postablative Hypothyroidism, dermatitis purpura. The patient presented for initial endocrinology clinic visit on 02/09/2021 for consultative assistance with her postablative hypothyroidism    She is S/P RAI ablation  at age 61 (05-02-1971) secondary to Graves' Disease that was diagnosed a couple of yrs prior . She was on PTU but developed a drug rash.   She has been followed by Delrae Rend MD  then [facility] for hypothyroidism     She was on Armour thyroid but switched to Synthroid in ~ 11/2020   Weight fluctuates , having difficult with weigh loss  Last LMP exactly a year ago  Has occasional constipation  Has occasional palpitations - had holter monitor 11/2020 work up negative     Denies depression or anxiety  Denies local neck symptoms  NO Biotin    No local Gyn   Synthroid 125 mcg daily    No  FH of thyroid disease    She is a PA    HISTORY:  Past Medical History:  Past Medical History:  Diagnosis Date   Graves disease    Hypothyroidism    Past Surgical History:  Past Surgical History:  Procedure Laterality Date   CESAREAN SECTION     SEPTOPLASTY      Social History:  reports that she has never smoked. She has never used smokeless tobacco. She reports that she does not drink alcohol and does not use drugs. Family History: family history is not on file.   HOME MEDICATIONS: Allergies as of 02/09/2021   No Known Allergies      Medication List        Accurate as of February 09, 2021  8:55 AM. If you have any questions, ask your nurse or doctor.          STOP taking these medications    doxycycline 100 MG  capsule Commonly known as: VIBRAMYCIN Stopped by: Scarlette Shorts, MD   thyroid 15 MG tablet Commonly known as: ARMOUR Stopped by: Scarlette Shorts, MD   thyroid 60 MG tablet Commonly known as: ARMOUR Stopped by: Scarlette Shorts, MD       TAKE these medications    fexofenadine 180 MG tablet Commonly known as: ALLEGRA Take 180 mg by mouth daily.   multivitamin tablet Take 1 tablet by mouth daily.   Synthroid 125 MCG tablet Generic drug: levothyroxine Take 125 mcg by mouth every morning.   triamcinolone 55 MCG/ACT Aero nasal inhaler Commonly known as: NASACORT Place 2 sprays into the nose daily.          REVIEW OF SYSTEMS: A comprehensive ROS was conducted with the patient and is negative except as per HPI     OBJECTIVE:  VS: BP 116/70 (BP Location: Left Arm, Patient Position: Sitting, Cuff Size: Small)   Pulse 83   Ht 5\' 4"  (1.626 m)   Wt 171 lb (77.6 kg)   SpO2 97%   BMI 29.35 kg/m    Wt Readings from Last 3 Encounters:  02/09/21 171 lb (77.6 kg)  11/24/20 165 lb (74.8 kg)  11/15/20 167 lb 9.6 oz (76 kg)  EXAM: General: Pt appears well and is in NAD  Eyes: External eye exam normal without stare, lid lag or exophthalmos.  EOM intact.    Neck: General: Supple without adenopathy. Thyroid: Thyroid size normal.  No goiter or nodules appreciated.  Lungs: Clear with good BS bilat with no rales, rhonchi, or wheezes  Heart: Auscultation: RRR.  Abdomen: Normoactive bowel sounds, soft, nontender, without masses or organomegaly palpable  Extremities:  BL LE: No pretibial edema normal ROM and strength.  Skin: Hair: Texture and amount normal with gender appropriate distribution Skin Inspection: No rashes Skin Palpation: Skin temperature, texture, and thickness normal to palpation  Neuro: Cranial nerves: II - XII grossly intact Motor: Normal strength throughout DTRs: 2+ and symmetric in UE without delay in relaxation phase  Mental Status:  Judgment, insight: Intact Orientation: Oriented to time, place, and person Mood and affect: No depression, anxiety, or agitation     DATA REVIEWED: 11/22/2020  TSH 0.330   Ant-TPO 12 IU/mL     ASSESSMENT/PLAN/RECOMMENDATIONS:   Postablative Hypothyroidism  -Patient is clinically euthyroid -No local neck symptoms -She had an episode of palpitations which is attributed to her thyroid as her TSH at the time was low at 0.33 you IU/mL -We will repeat TSH  Medications : Synthroid 125 MCG daily  2. Hx of Graves' Disease:   -No extrathyroidal manifestations of Graves' disease  Follow-up in 6 months   Signed electronically by: Lyndle Herrlich, MD  [facility]ocr[facility]  [facility] [address] [place] [place], [place] [address] Phone: [phone] FAX: [phone]   CC: Philip Aspen, Limmie Patricia, MD [address] [place] [address] Phone: [phone] Fax: [phone]   Return to Endocrinology clinic as below: No future appointments.

## 2021-02-09 ENCOUNTER — Ambulatory Visit: Payer: Commercial Managed Care - PPO | Admitting: Internal Medicine

## 2021-02-09 ENCOUNTER — Encounter: Payer: Self-pay | Admitting: Internal Medicine

## 2021-02-09 ENCOUNTER — Other Ambulatory Visit: Payer: Self-pay

## 2021-02-09 VITALS — BP 116/70 | HR 83 | Ht 64.0 in | Wt 171.0 lb

## 2021-02-09 DIAGNOSIS — E890 Postprocedural hypothyroidism: Secondary | ICD-10-CM

## 2021-02-09 DIAGNOSIS — Z8639 Personal history of other endocrine, nutritional and metabolic disease: Secondary | ICD-10-CM | POA: Diagnosis not present

## 2021-02-09 NOTE — Patient Instructions (Signed)
You are on Synthroid  - which is your thyroid hormone supplement. You MUST take this consistently.  You should take this first thing in the morning on an empty stomach with water. You should not take it with other medications. Wait 30min to 1hr prior to eating. If you are taking any vitamins - please take these in the evening.   If you miss a dose, please take your missed dose the following day (double the dose for that day). You should have a pill box for ONLY Synthroid  on your bedside table to help you remember to take your medications.  

## 2021-02-10 ENCOUNTER — Encounter: Payer: Self-pay | Admitting: Internal Medicine

## 2021-02-11 ENCOUNTER — Other Ambulatory Visit: Payer: Self-pay

## 2021-02-11 ENCOUNTER — Other Ambulatory Visit (INDEPENDENT_AMBULATORY_CARE_PROVIDER_SITE_OTHER): Payer: Commercial Managed Care - PPO

## 2021-02-11 DIAGNOSIS — E890 Postprocedural hypothyroidism: Secondary | ICD-10-CM | POA: Diagnosis not present

## 2021-02-11 LAB — T4, FREE: Free T4: 0.93 ng/dL (ref 0.60–1.60)

## 2021-02-11 LAB — TSH: TSH: 2.07 u[IU]/mL (ref 0.35–5.50)

## 2021-02-11 MED ORDER — SYNTHROID 125 MCG PO TABS: 125.0000 ug | ORAL_TABLET | Freq: Every morning | ORAL | 3 refills | Status: DC

## 2021-04-20 ENCOUNTER — Encounter: Payer: Self-pay | Admitting: Internal Medicine

## 2021-04-20 ENCOUNTER — Other Ambulatory Visit: Payer: Self-pay

## 2021-04-20 ENCOUNTER — Ambulatory Visit (INDEPENDENT_AMBULATORY_CARE_PROVIDER_SITE_OTHER): Payer: Commercial Managed Care - PPO | Admitting: Internal Medicine

## 2021-04-20 VITALS — BP 110/70 | HR 86 | Temp 97.8°F | Ht 63.75 in | Wt 171.2 lb

## 2021-04-20 DIAGNOSIS — Z1211 Encounter for screening for malignant neoplasm of colon: Secondary | ICD-10-CM

## 2021-04-20 DIAGNOSIS — Z1231 Encounter for screening mammogram for malignant neoplasm of breast: Secondary | ICD-10-CM | POA: Diagnosis not present

## 2021-04-20 DIAGNOSIS — Z0000 Encounter for general adult medical examination without abnormal findings: Secondary | ICD-10-CM | POA: Diagnosis not present

## 2021-04-20 DIAGNOSIS — E890 Postprocedural hypothyroidism: Secondary | ICD-10-CM | POA: Diagnosis not present

## 2021-04-20 NOTE — Progress Notes (Signed)
Established Patient Office Visit     This visit occurred during the SARS-CoV-2 public health emergency.  Safety protocols were in place, including screening questions prior to the visit, additional usage of staff PPE, and extensive cleaning of exam room while observing appropriate contact time as indicated for disinfecting solutions.    CC/Reason for Visit: Annual preventive exam  HPI: Yolanda Hall is a 50 y.o. female who is coming in today for the above mentioned reasons. Past Medical History is significant for: Post ablative hypothyroidism followed by endocrinology with a recently normal TSH.  She has routine eye and dental care, she exercises routinely, she will return another day for lab work as she is not fasting.  She is overdue for flu vaccine, COVID vaccines and shingles vaccinations.  She is overdue for mammogram and colonoscopy.  She had a normal cervical cancer screening exam in 2020.  She has no acute concerns or complaints today.   Past Medical/Surgical History: Past Medical History:  Diagnosis Date   Graves disease    Hypothyroidism     Past Surgical History:  Procedure Laterality Date   CESAREAN SECTION     SEPTOPLASTY      Social History:  reports that she has never smoked. She has never used smokeless tobacco. She reports that she does not drink alcohol and does not use drugs.  Allergies: No Known Allergies  Family History:  No history of heart disease, cancer, stroke that she is aware of.   Current Outpatient Medications:    fexofenadine (ALLEGRA) 180 MG tablet, Take 180 mg by mouth daily., Disp: , Rfl:    Multiple Vitamin (MULTIVITAMIN) tablet, Take 1 tablet by mouth daily., Disp: , Rfl:    SYNTHROID 125 MCG tablet, Take 1 tablet (125 mcg total) by mouth every morning., Disp: 90 tablet, Rfl: 3   triamcinolone (NASACORT) 55 MCG/ACT AERO nasal inhaler, Place 2 sprays into the nose daily., Disp: , Rfl:    Vitamin D, Ergocalciferol, (DRISDOL)  1.25 MG (50000 UNIT) CAPS capsule, Take 50,000 Units by mouth once a week., Disp: , Rfl:   Review of Systems:  Constitutional: Denies fever, chills, diaphoresis, appetite change and fatigue.  HEENT: Denies photophobia, eye pain, redness, hearing loss, ear pain, congestion, sore throat, rhinorrhea, sneezing, mouth sores, trouble swallowing, neck pain, neck stiffness and tinnitus.   Respiratory: Denies SOB, DOE, cough, chest tightness,  and wheezing.   Cardiovascular: Denies chest pain, palpitations and leg swelling.  Gastrointestinal: Denies nausea, vomiting, abdominal pain, diarrhea, constipation, blood in stool and abdominal distention.  Genitourinary: Denies dysuria, urgency, frequency, hematuria, flank pain and difficulty urinating.  Endocrine: Denies: hot or cold intolerance, sweats, changes in hair or nails, polyuria, polydipsia. Musculoskeletal: Denies myalgias, back pain, joint swelling, arthralgias and gait problem.  Skin: Denies pallor, rash and wound.  Neurological: Denies dizziness, seizures, syncope, weakness, light-headedness, numbness and headaches.  Hematological: Denies adenopathy. Easy bruising, personal or family bleeding history  Psychiatric/Behavioral: Denies suicidal ideation, mood changes, confusion, nervousness, sleep disturbance and agitation    Physical Exam: Vitals:   04/20/21 1329  BP: 110/70  Pulse: 86  Temp: 97.8 F (36.6 C)  TempSrc: Oral  SpO2: 96%  Weight: 171 lb 3.2 oz (77.7 kg)  Height: 5' 3.75" (1.619 m)    Body mass index is 29.62 kg/m.   Constitutional: NAD, calm, comfortable Eyes: PERRL, lids and conjunctivae normal ENMT: Mucous membranes are moist. Posterior pharynx clear of any exudate or lesions. Normal dentition. Tympanic membrane is  pearly white, no erythema or bulging. Neck: normal, supple, no masses, no thyromegaly Respiratory: clear to auscultation bilaterally, no wheezing, no crackles. Normal respiratory effort. No accessory muscle  use.  Cardiovascular: Regular rate and rhythm, no murmurs / rubs / gallops. No extremity edema. 2+ pedal pulses. No carotid bruits.  Abdomen: no tenderness, no masses palpated. No hepatosplenomegaly. Bowel sounds positive.  Musculoskeletal: no clubbing / cyanosis. No joint deformity upper and lower extremities. Good ROM, no contractures. Normal muscle tone.  Skin: no rashes, lesions, ulcers. No induration Neurologic: CN 2-12 grossly intact. Sensation intact, DTR normal. Strength 5/5 in all 4.  Psychiatric: Normal judgment and insight. Alert and oriented x 3. Normal mood.    Impression and Plan:  Encounter for preventive health examination -Recommend routine eye and dental care. -Immunizations: She is overdue for flu, COVID, shingles, declines today despite counseling -Healthy lifestyle discussed in detail. -Labs to be updated today. -Colon cancer screening: Referral to GI for initial screening colonoscopy -Breast cancer screening: Mammogram ordered, last normal January/2021 -Cervical cancer screening: 2020 with GYN -Lung cancer screening: Not applicable -Prostate cancer screening: Not applicable -DEXA: Not applicable  Screening for malignant neoplasm of colon  - Plan: Ambulatory referral to Gastroenterology  Screening mammogram for breast cancer  - Plan: MM Digital Screening  Postablative hypothyroidism  -Followed by endocrinology with recently normal thyroid function test.    Lelon Frohlich, MD [facility]

## 2021-04-22 ENCOUNTER — Encounter: Payer: Self-pay | Admitting: Internal Medicine

## 2021-04-22 ENCOUNTER — Other Ambulatory Visit (INDEPENDENT_AMBULATORY_CARE_PROVIDER_SITE_OTHER): Payer: Commercial Managed Care - PPO

## 2021-04-22 ENCOUNTER — Other Ambulatory Visit: Payer: Self-pay | Admitting: Internal Medicine

## 2021-04-22 ENCOUNTER — Other Ambulatory Visit: Payer: Self-pay

## 2021-04-22 DIAGNOSIS — R7302 Impaired glucose tolerance (oral): Secondary | ICD-10-CM | POA: Insufficient documentation

## 2021-04-22 DIAGNOSIS — R739 Hyperglycemia, unspecified: Secondary | ICD-10-CM

## 2021-04-22 DIAGNOSIS — E890 Postprocedural hypothyroidism: Secondary | ICD-10-CM | POA: Diagnosis not present

## 2021-04-22 LAB — COMPREHENSIVE METABOLIC PANEL
ALT: 19 U/L (ref 0–35)
AST: 17 U/L (ref 0–37)
Albumin: 4.3 g/dL (ref 3.5–5.2)
Alkaline Phosphatase: 97 U/L (ref 39–117)
BUN: 17 mg/dL (ref 6–23)
CO2: 28 mEq/L (ref 19–32)
Calcium: 9.4 mg/dL (ref 8.4–10.5)
Chloride: 104 mEq/L (ref 96–112)
Creatinine, Ser: 0.79 mg/dL (ref 0.40–1.20)
GFR: 85.9 mL/min (ref >60.00)
Glucose, Bld: 87 mg/dL (ref 70–99)
Potassium: 3.8 mEq/L (ref 3.5–5.1)
Sodium: 138 mEq/L (ref 135–145)
Total Bilirubin: 0.6 mg/dL (ref 0.2–1.2)
Total Protein: 7.1 g/dL (ref 6.0–8.3)

## 2021-04-22 LAB — LIPID PANEL
Cholesterol: 162 mg/dL (ref 0–200)
HDL: 49 mg/dL (ref >39.00)
LDL Cholesterol: 96 mg/dL (ref 0–99)
NonHDL: 112.81
Total CHOL/HDL Ratio: 3
Triglycerides: 83 mg/dL (ref 0.0–149.0)
VLDL: 16.6 mg/dL (ref 0.0–40.0)

## 2021-04-22 LAB — CBC WITH DIFFERENTIAL/PLATELET
Basophils Absolute: 0.1 10*3/uL (ref 0.0–0.1)
Basophils Relative: 1.4 % (ref 0.0–3.0)
Eosinophils Absolute: 0.2 10*3/uL (ref 0.0–0.7)
Eosinophils Relative: 5.5 % — ABNORMAL HIGH (ref 0.0–5.0)
HCT: 43.3 % (ref 36.0–46.0)
Hemoglobin: 14.5 g/dL (ref 12.0–15.0)
Lymphocytes Relative: 37.8 % (ref 12.0–46.0)
Lymphs Abs: 1.6 10*3/uL (ref 0.7–4.0)
MCHC: 33.4 g/dL (ref 30.0–36.0)
MCV: 91.7 fl (ref 78.0–100.0)
Monocytes Absolute: 0.3 10*3/uL (ref 0.1–1.0)
Monocytes Relative: 7.6 % (ref 3.0–12.0)
Neutro Abs: 2.1 10*3/uL (ref 1.4–7.7)
Neutrophils Relative %: 47.7 % (ref 43.0–77.0)
Platelets: 309 10*3/uL (ref 150.0–400.0)
RBC: 4.72 Mil/uL (ref 3.87–5.11)
RDW: 12.4 % (ref 11.5–15.5)
WBC: 4.3 10*3/uL (ref 4.0–10.5)

## 2021-04-22 LAB — HEMOGLOBIN A1C: Hgb A1c MFr Bld: 6.2 % (ref 4.6–6.5)

## 2021-05-16 ENCOUNTER — Telehealth: Payer: Self-pay | Admitting: Internal Medicine

## 2021-05-16 ENCOUNTER — Telehealth: Payer: Self-pay

## 2021-05-16 DIAGNOSIS — R002 Palpitations: Secondary | ICD-10-CM

## 2021-05-16 NOTE — Telephone Encounter (Signed)
Patient called and asked for an appointment with Dr.Hernandez as she has been having heart palpitations for the last couple days. Patient also stated she had an episode where she felt like she was going to faint but she didn't, states it was a light headedness or dizzy feeling. I let patient know that she would have to be sent to triage as Dr.Hernandez's only available was for Friday and we needed to know how soon she needed to be seen. Patient was transferred to triage.

## 2021-05-16 NOTE — Telephone Encounter (Signed)
Patient called stating she is having palpitations and was transferred to a triage nurse patient called back stating she is not going to ED or UC she has had this issue before and thinks it could be related to her thyroid. Patient wants to be seen and referred to a Cardiologist

## 2021-05-16 NOTE — Telephone Encounter (Signed)
Excess nurse  Lanora Manis call and stated pt refuse to go the  ER and want to be seen in office  are referred to  cardiology .chest pain ,pressure,heaviness or tightness heart palpitatiions and a near syncopal event

## 2021-05-17 ENCOUNTER — Ambulatory Visit: Payer: Commercial Managed Care - PPO | Admitting: Family Medicine

## 2021-05-17 ENCOUNTER — Encounter: Payer: Self-pay | Admitting: Family Medicine

## 2021-05-17 VITALS — BP 116/70 | HR 78 | Temp 97.8°F | Ht 63.78 in | Wt 171.1 lb

## 2021-05-17 DIAGNOSIS — R002 Palpitations: Secondary | ICD-10-CM

## 2021-05-17 DIAGNOSIS — R42 Dizziness and giddiness: Secondary | ICD-10-CM

## 2021-05-17 NOTE — Telephone Encounter (Signed)
---  Caller states she had heart palpitations and a near syncope episode last week. States that she is having palpitations and tachycardia (even at rest). States some chest discomfort.  05/16/2021 3:20:19 PM Go to ED Now Michell Heinrich, RN, Heloise Purpura Disagrees  Comments User: Patrica Duel, RN Date/Time Lamount Cohen Time): 05/16/2021 3:17:42 PM Denies chest pain.  User: Patrica Duel, RN Date/Time Lamount Cohen Time): 05/16/2021 3:19:57 PM Caller states that she is having slight chest pressure when she has palpitations.  User: Patrica Duel, RN Date/Time Lamount Cohen Time): 05/16/2021 3:22:43 PM Caller states that she is not going to the ER. States that she wants to be seen in the office or referred to cardiology( was seen in ER previously for this.) Advised risk of not being seen in ED now includes getting worse and possible death. Verbalizes understanding. Advised that I would reach out to the office and let them know of what is going on and that someone will call her back. Verbalizes understanding.  User: Patrica Duel, RN Date/Time Lamount Cohen Time): 05/16/2021 3:26:14 PM Called back line (took 2 attempts to get through). Spoke with office Artist). Advised of situation. States that she would send a message to provider.  Referrals GO TO FACILITY REFUSED

## 2021-05-17 NOTE — Addendum Note (Signed)
Addended by: Kern Reap B on: 05/17/2021 01:58 PM   Modules accepted: Orders

## 2021-05-17 NOTE — Progress Notes (Signed)
Established Patient Office Visit  Subjective:  Patient ID: Yolanda Hall, female    DOB: [birth year]  Age: 50 y.o. MRN: [id number]  CC:  Chief Complaint  Patient presents with   Palpitations    Patient complains of palpitations,    HPI Yolanda Hall presents for ongoing issues with palpitations and recent increase symptoms of dizziness with exercise.  No history of syncope.  She had been seen here June 6 with increased palpitations.  EKG at that time showed sinus rhythm with no acute ST-T changes.  We arranged for cardiac event monitor which did confirm PVCs and PACs.  She exercises regularly and states that she has had frequent episodes recently on the treadmill where she has to hold onto the handrail because of some dizziness with exercise.  No chest pain.  She also has had occasional spikes in her heart rate to 130 at rest.  She exercises regularly and states her heart rate at rest is normally about 50.  She has been very reluctant to consider even low-dose beta-blockers because of her low resting heart rate.  Patient relates she had echocardiogram about 20 years ago but not recently. Her primary placed order for cardiology evaluation yesterday.  Interestingly, she has not noted dizziness when she is on exercise cycle with intense exercise but has noticed this fairly consistently on the treadmill.  She denies any vestibular symptoms or vertigo.  Is not having any consistent orthostatic symptoms.  No family history of premature CAD.  Patient has history of Graves' disease with post ablative hypothyroidism.  She is on replacement with Synthroid.  Recent TSH in September normal.  Past Medical History:  Diagnosis Date   Graves disease    Hypothyroidism     Past Surgical History:  Procedure Laterality Date   CESAREAN SECTION     SEPTOPLASTY      History reviewed. No pertinent family history.  Social History   Socioeconomic History   Marital status: Married     Spouse name: Not on file   Number of children: Not on file   Years of education: Not on file   Highest education level: Not on file  Occupational History   Not on file  Tobacco Use   Smoking status: Never   Smokeless tobacco: Never  Substance and Sexual Activity   Alcohol use: Never   Drug use: Never   Sexual activity: Not on file  Other Topics Concern   Not on file  Social History Narrative   Not on file   Social Determinants of Health   Financial Resource Strain: Not on file  Food Insecurity: Not on file  Transportation Needs: Not on file  Physical Activity: Not on file  Stress: Not on file  Social Connections: Not on file  Intimate Partner Violence: Not on file    Outpatient Medications Prior to Visit  Medication Sig Dispense Refill   fexofenadine (ALLEGRA) 180 MG tablet Take 180 mg by mouth daily.     Multiple Vitamin (MULTIVITAMIN) tablet Take 1 tablet by mouth daily.     SYNTHROID 125 MCG tablet Take 1 tablet (125 mcg total) by mouth every morning. 90 tablet 3   triamcinolone (NASACORT) 55 MCG/ACT AERO nasal inhaler Place 2 sprays into the nose daily.     Vitamin D, Ergocalciferol, (DRISDOL) 1.25 MG (50000 UNIT) CAPS capsule Take 50,000 Units by mouth once a week.     No facility-administered medications prior to visit.    No Known Allergies  ROS Review of Systems  Constitutional:  Negative for chills and fever.  Respiratory:  Negative for cough and shortness of breath.   Cardiovascular:  Positive for palpitations. Negative for chest pain.  Neurological:  Positive for dizziness. Negative for syncope.  Psychiatric/Behavioral:  Negative for confusion.      Objective:    Physical Exam Vitals reviewed.  Constitutional:      Appearance: Normal appearance.  Cardiovascular:     Comments: Palpated rate is currently 72 Pulmonary:     Effort: Pulmonary effort is normal.     Breath sounds: Normal breath sounds. No wheezing or rales.  Musculoskeletal:      Cervical back: Neck supple.     Right lower leg: No edema.     Left lower leg: No edema.  Lymphadenopathy:     Cervical: No cervical adenopathy.  Neurological:     Mental Status: She is alert.    BP 114/74 (BP Location: Left Arm, Patient Position: Sitting, Cuff Size: Normal)   Pulse 78   Temp 97.8 F (36.6 C) (Oral)   Ht 5' 3.78" (1.62 m)   Wt 171 lb 1.6 oz (77.6 kg)   SpO2 97%   BMI 29.57 kg/m  Wt Readings from Last 3 Encounters:  05/17/21 171 lb 1.6 oz (77.6 kg)  04/20/21 171 lb 3.2 oz (77.7 kg)  02/09/21 171 lb (77.6 kg)     Health Maintenance Due  Topic Date Due   HIV Screening  Never done   Hepatitis C Screening  Never done    There are no preventive care reminders to display for this patient.  Lab Results  Component Value Date   TSH 2.07 02/11/2021   Lab Results  Component Value Date   WBC 4.3 04/22/2021   HGB 14.5 04/22/2021   HCT 43.3 04/22/2021   MCV 91.7 04/22/2021   PLT 309.0 04/22/2021   Lab Results  Component Value Date   NA 138 04/22/2021   K 3.8 04/22/2021   CO2 28 04/22/2021   GLUCOSE 87 04/22/2021   BUN 17 04/22/2021   CREATININE 0.79 04/22/2021   BILITOT 0.6 04/22/2021   ALKPHOS 97 04/22/2021   AST 17 04/22/2021   ALT 19 04/22/2021   PROT 7.1 04/22/2021   ALBUMIN 4.3 04/22/2021   CALCIUM 9.4 04/22/2021   ANIONGAP 5 11/24/2020   GFR 85.90 04/22/2021   Lab Results  Component Value Date   CHOL 162 04/22/2021   Lab Results  Component Value Date   HDL 49.00 04/22/2021   Lab Results  Component Value Date   LDLCALC 96 04/22/2021   Lab Results  Component Value Date   TRIG 83.0 04/22/2021   Lab Results  Component Value Date   CHOLHDL 3 04/22/2021   Lab Results  Component Value Date   HGBA1C 6.2 04/22/2021      Assessment & Plan:   Ongoing palpitations.  Past history of PVCs and PACs by event monitoring.  She states that her resting pulse is frequently upper 40s to low 50s and she is reluctant to consider low-dose  beta-blocker.  Concerning is her recent symptoms of dizziness with exercise.  No associated syncope.  Cardiology referral has been placed.  No obvious murmur but will likely need echocardiogram and further evaluation possibly with exercise stress testing.  She does not demonstrate any orthostatic changes today with seated and standing blood pressure 116/70  No orders of the defined types were placed in this encounter.   Follow-up: No follow-ups on file.  Carolann Littler, MD

## 2021-05-17 NOTE — Telephone Encounter (Signed)
Spoke with patient and referral placed for cardiology.  Appointment scheduled with Dr Caryl Never 05/17/21.

## 2021-05-17 NOTE — Telephone Encounter (Signed)
Pt has appt today with Dr. Caryl Never

## 2021-05-17 NOTE — Telephone Encounter (Signed)
Left message on machine for patient to return our call 

## 2021-05-18 ENCOUNTER — Ambulatory Visit: Payer: Commercial Managed Care - PPO | Admitting: Internal Medicine

## 2021-05-18 ENCOUNTER — Other Ambulatory Visit: Payer: Self-pay

## 2021-05-18 VITALS — BP 121/84 | HR 66 | Ht 64.0 in | Wt 172.0 lb

## 2021-05-18 DIAGNOSIS — E0500 Thyrotoxicosis with diffuse goiter without thyrotoxic crisis or storm: Secondary | ICD-10-CM | POA: Diagnosis not present

## 2021-05-18 DIAGNOSIS — I491 Atrial premature depolarization: Secondary | ICD-10-CM

## 2021-05-18 DIAGNOSIS — R55 Syncope and collapse: Secondary | ICD-10-CM | POA: Diagnosis not present

## 2021-05-18 DIAGNOSIS — I493 Ventricular premature depolarization: Secondary | ICD-10-CM

## 2021-05-18 MED ORDER — DILTIAZEM HCL 30 MG PO TABS: 30.0000 mg | ORAL_TABLET | Freq: Four times a day (QID) | ORAL | 3 refills | Status: DC | PRN

## 2021-05-18 NOTE — Patient Instructions (Signed)
Medication Instructions:  Your physician has recommended you make the following change in your medication:  START: diltiazem (Cardizem) 30 mg by mouth as needed every 6 hours for palpitations  *If you need a refill on your cardiac medications before your next appointment, please call your pharmacy*   Lab Work: NONE If you have labs (blood work) drawn today and your tests are completely normal, you will receive your results only by: MyChart Message (if you have MyChart) OR A paper copy in the mail If you have any lab test that is abnormal or we need to change your treatment, we will call you to review the results.   Testing/Procedures: Your physician has requested that you have an echocardiogram. Echocardiography is a painless test that uses sound waves to create images of your heart. It provides your doctor with information about the size and shape of your heart and how well your heart's chambers and valves are working. This procedure takes approximately one hour. There are no restrictions for this procedure.  Your physician has requested that you have an exercise tolerance test. For further information please visit [email or web address] Please also follow instruction sheet, as given.    Follow-Up: At [facility], you and your health needs are our priority.  As part of our continuing mission to provide you with exceptional heart care, we have created designated Provider Care Teams.  These Care Teams include your primary Cardiologist (physician) and Advanced Practice Providers (APPs -  Physician Assistants and Nurse Practitioners) who all work together to provide you with the care you need, when you need it.   Your next appointment:   3 month(s)  The format for your next appointment:   In Person  Provider:   Christell Constant, MD

## 2021-05-18 NOTE — Progress Notes (Signed)
Cardiology Office Note:    Date:  05/18/2021   ID:  Yolanda Hall, DOB 05-29-1971, MRN [id number]  PCP:  Yolanda Hall, Yolanda Patricia, MD   [facility] Providers Cardiologist:  Yolanda Constant, MD     Referring MD: Yolanda Hall, Estel*   CC: PVC and PACs Consulted for the evaluation of palpitations at the behest of Yolanda Hall, Yolanda Patricia, MD   History of Present Illness:    Yolanda Hall is a 50 y.o. female with a hx of Grave's disease and hypothyroidism, who presents for evaluation 05/18/21.  Patient notes that she is feeling worse from her PVCs.  She notes that now when she gets of the treadmill she have new funny heart beats.  Has had no chest pain, chest pressure, chest tightness, chest stinging with exercise.  Discomfort occurs with near syncope with mild exertion. No syncope.  Feels unstable on her feet.  When these funny heart beats occur  she feels unsafe.  Also notes new DOE. No orthostatic symptoms.   Patient exertion notable for being on the treadmill and feels no symptoms.  No shortness of breath, sudden onset DOE.  No PND or orthopnea.  No weight gain, leg swelling , or abdominal swelling.  No syncope .Grave's disease has been well controlled    Patient reports prior cardiac testing including   No history of pre-eclampsia, gestation HTN or gestational DM.      Past Medical History:  Diagnosis Date   Graves disease    Hypothyroidism    Palpitations     Past Surgical History:  Procedure Laterality Date   CESAREAN SECTION     SEPTOPLASTY      Current Medications: Current Meds  Medication Sig   diltiazem (CARDIZEM) 30 MG tablet Take 1 tablet (30 mg total) by mouth every 6 (six) hours as needed (palpitations).   fexofenadine (ALLEGRA) 180 MG tablet Take 180 mg by mouth daily.   Multiple Vitamin (MULTIVITAMIN) tablet Take 1 tablet by mouth daily.   SYNTHROID 125 MCG tablet Take 1 tablet (125 mcg total) by mouth every morning.    triamcinolone (NASACORT) 55 MCG/ACT AERO nasal inhaler Place 2 sprays into the nose daily.   Vitamin D, Ergocalciferol, (DRISDOL) 1.25 MG (50000 UNIT) CAPS capsule Take 50,000 Units by mouth once a week.     Allergies:   Patient has no known allergies.   Social History   Socioeconomic History   Marital status: Married    Spouse name: Not on file   Number of children: Not on file   Years of education: Not on file   Highest education level: Not on file  Occupational History   Not on file  Tobacco Use   Smoking status: Never   Smokeless tobacco: Never  Substance and Sexual Activity   Alcohol use: Never   Drug use: Never   Sexual activity: Not on file  Other Topics Concern   Not on file  Social History Narrative   Not on file   Social Determinants of Health   Financial Resource Strain: Not on file  Food Insecurity: Not on file  Transportation Needs: Not on file  Physical Activity: Not on file  Stress: Not on file  Social Connections: Not on file    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 History of heart failure notable for no members. History of arrhythmia notable for no members. Denies family history  of sudden cardiac death including drowning, car accidents, or unexplained deaths in the family. No history of bicuspid aortic valve or aortic aneurysm or dissection.   No history of cardiomyopathies including hypertrophic cardiomyopathy, left ventricular non-compaction, or arrhythmogenic right ventricular cardiomyopathy.  ROS:   Please see the history of present illness.     All other systems reviewed and are negative.  EKGs/Labs/Other Studies Reviewed:    The following studies were reviewed today:  EKG:  EKG is  ordered today.  The ekg ordered today demonstrates  05/18/21: SR rate 66 WNL 11/24/20:  SR 67  Cardiac Event Monitoring: Date: 12/21/20 Results: Sinus rhythm with average heart  rate of 72 BPM. Rare PVC's and PAC's - occasional association with symptom of fluttering, although fluttering is also noted during sinus rhythm and sinus tachycardia No pauses, no atrial fibrillation. Reassuring monitor with no adverse arrhythmias    Recent Labs: 02/11/2021: TSH 2.07 04/22/2021: ALT 19; BUN 17; Creatinine, Ser 0.79; Hemoglobin 14.5; Platelets 309.0; Potassium 3.8; Sodium 138  Recent Lipid Panel    Component Value Date/Time   CHOL 162 04/22/2021 0835   TRIG 83.0 04/22/2021 0835   HDL 49.00 04/22/2021 0835   CHOLHDL 3 04/22/2021 0835   VLDL 16.6 04/22/2021 0835   LDLCALC 96 04/22/2021 0835    Physical Exam:    VS:  BP 121/84   Pulse 66   Ht 5\' 4"  (1.626 m)   Wt 172 lb (78 kg)   SpO2 97%   BMI 29.52 kg/m     Wt Readings from Last 3 Encounters:  05/18/21 172 lb (78 kg)  05/17/21 171 lb 1.6 oz (77.6 kg)  04/20/21 171 lb 3.2 oz (77.7 kg)    Gen: no distress   Neck: No JVD,  Ears: no 13/09/22 Sign Cardiac: No Rubs or Gallops, no Murmur, regular rhythm +2 radial pulses Respiratory: Clear to auscultation bilaterally, normal effort, normal  respiratory rate GI: Soft, nontender, non-distended  MS: No  edema;  moves all extremities Integument: Skin feels warm Neuro:  At time of evaluation, alert and oriented to person/place/time/situation  Psych: Normal affect, patient feels warm   ASSESSMENT:    1. Near syncope   2. Graves disease   3. PAC (premature atrial contraction)   4. PVC (premature ventricular contraction)    PLAN:    PACs/PVCs Near syncope DOE Grave's Disease and hypothyroidism - will get Echo and POET looking for exercise induced ectopy - will trial diltiazem 30 mg Q6 hr PO PRN Palpitations - minimize coffee intake - 3 months me or APP      Shared Decision Making/Informed Consent The risks [chest pain, shortness of breath, cardiac arrhythmias, dizziness, blood pressure fluctuations, myocardial infarction, stroke/transient ischemic attack,  and life-threatening complications (estimated to be 1 in 10,000)], benefits (risk stratification, diagnosing coronary artery disease, treatment guidance) and alternatives of an exercise tolerance test were discussed in detail with [name] and she agrees to proceed.    Medication Adjustments/Labs and Tests Ordered: Current medicines are reviewed at length with the patient today.  Concerns regarding medicines are outlined above.  Orders Placed This Encounter  Procedures   EXERCISE TOLERANCE TEST (ETT)   EKG 12-Lead   ECHOCARDIOGRAM COMPLETE    Meds ordered this encounter  Medications   diltiazem (CARDIZEM) 30 MG tablet    Sig: Take 1 tablet (30 mg total) by mouth every 6 (six) hours as needed (palpitations).    Dispense:  30 tablet    Refill:  3  Patient Instructions  Medication Instructions:  Your physician has recommended you make the following change in your medication:  START: diltiazem (Cardizem) 30 mg by mouth as needed every 6 hours for palpitations  *If you need a refill on your cardiac medications before your next appointment, please call your pharmacy*   Lab Work: NONE If you have labs (blood work) drawn today and your tests are completely normal, you will receive your results only by: MyChart Message (if you have MyChart) OR A paper copy in the mail If you have any lab test that is abnormal or we need to change your treatment, we will call you to review the results.   Testing/Procedures: Your physician has requested that you have an echocardiogram. Echocardiography is a painless test that uses sound waves to create images of your heart. It provides your doctor with information about the size and shape of your heart and how well your heart's chambers and valves are working. This procedure takes approximately one hour. There are no restrictions for this procedure.  Your physician has requested that you have an exercise tolerance test. For further information  please visit [email or web address] Please also follow instruction sheet, as given.    Follow-Up: At [facility], you and your health needs are our priority.  As part of our continuing mission to provide you with exceptional heart Hall, we have created designated Provider Hall Teams.  These Hall Teams include your primary Cardiologist (physician) and Advanced Practice Providers (APPs -  Physician Assistants and Nurse Practitioners) who all work together to provide you with the Hall you need, when you need it.   Your next appointment:   3 month(s)  The format for your next appointment:   In Person  Provider:   Christell Constant, MD      Signed, Yolanda Constant, MD  05/18/2021 2:41 PM    [facility]

## 2021-05-24 ENCOUNTER — Other Ambulatory Visit: Payer: Self-pay

## 2021-05-24 ENCOUNTER — Ambulatory Visit (INDEPENDENT_AMBULATORY_CARE_PROVIDER_SITE_OTHER): Payer: Commercial Managed Care - PPO

## 2021-05-24 ENCOUNTER — Ambulatory Visit (HOSPITAL_COMMUNITY): Payer: Commercial Managed Care - PPO | Attending: Internal Medicine

## 2021-05-24 DIAGNOSIS — R55 Syncope and collapse: Secondary | ICD-10-CM

## 2021-05-24 LAB — ECHOCARDIOGRAM COMPLETE
Area-P 1/2: 3.91 cm2
S' Lateral: 2.5 cm

## 2021-05-24 LAB — EXERCISE TOLERANCE TEST
Angina Index: 0
Duke Treadmill Score: 9
Estimated workload: 10.1
Exercise duration (min): 9 min
Exercise duration (sec): 0 s
MPHR: 168 {beats}/min
Peak HR: 162 {beats}/min
Percent HR: 96 %
RPE: 14
Rest HR: 88 {beats}/min
ST Depression (mm): 0 mm

## 2021-05-24 NOTE — Progress Notes (Unsigned)
Attestation order placed for MD to sign.

## 2021-05-24 NOTE — Addendum Note (Signed)
Addended by: Riley Lam A on: 05/24/2021 09:03 AM   Modules accepted: Orders

## 2021-05-25 ENCOUNTER — Encounter: Payer: Self-pay | Admitting: Internal Medicine

## 2021-05-26 ENCOUNTER — Telehealth: Payer: Self-pay | Admitting: Internal Medicine

## 2021-05-26 DIAGNOSIS — R55 Syncope and collapse: Secondary | ICD-10-CM

## 2021-05-26 DIAGNOSIS — Z79899 Other long term (current) drug therapy: Secondary | ICD-10-CM

## 2021-05-26 MED ORDER — LISINOPRIL 2.5 MG PO TABS
2.5000 mg | ORAL_TABLET | Freq: Every day | ORAL | 3 refills | Status: DC
Start: 2021-05-26 — End: 2021-12-06

## 2021-05-26 NOTE — Telephone Encounter (Signed)
Pt c/o medication issue:  1. Name of Medication: does not know  2. How are you currently taking this medication (dosage and times per day)? Not currently taking  3. Are you having a reaction (difficulty breathing--STAT)? no  4. What is your medication issue? Patient requesting to speak with triage. She states she spoke with someone in refills about a medication that was supposed to be sent in and they told her to ask for triage.

## 2021-05-26 NOTE — Telephone Encounter (Signed)
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  Lisinopril 2.5 mg #90 X 3 sent electronically into [facility] [address] as requested.  Order placed for BMP in 2 weeks.  Scheduled for 12/29.

## 2021-06-07 ENCOUNTER — Other Ambulatory Visit (HOSPITAL_COMMUNITY): Payer: Commercial Managed Care - PPO

## 2021-06-09 ENCOUNTER — Other Ambulatory Visit: Payer: Commercial Managed Care - PPO | Admitting: *Deleted

## 2021-06-09 ENCOUNTER — Other Ambulatory Visit: Payer: Self-pay

## 2021-06-09 DIAGNOSIS — R55 Syncope and collapse: Secondary | ICD-10-CM

## 2021-06-09 DIAGNOSIS — Z79899 Other long term (current) drug therapy: Secondary | ICD-10-CM

## 2021-06-09 LAB — BASIC METABOLIC PANEL
BUN/Creatinine Ratio: 26 — ABNORMAL HIGH (ref 9–23)
BUN: 21 mg/dL (ref 6–24)
CO2: 24 mmol/L (ref 20–29)
Calcium: 10.1 mg/dL (ref 8.7–10.2)
Chloride: 103 mmol/L (ref 96–106)
Creatinine, Ser: 0.82 mg/dL (ref 0.57–1.00)
Glucose: 95 mg/dL (ref 70–99)
Potassium: 4.6 mmol/L (ref 3.5–5.2)
Sodium: 140 mmol/L (ref 134–144)
eGFR: 86 mL/min/{1.73_m2} (ref >59)

## 2021-06-20 ENCOUNTER — Telehealth: Payer: Self-pay | Admitting: Internal Medicine

## 2021-06-20 DIAGNOSIS — R002 Palpitations: Secondary | ICD-10-CM

## 2021-06-20 NOTE — Telephone Encounter (Signed)
Pt has not taken diltiazem as ordered pt reports feelings of fast heart beats do not last long.  Pt expresses concern that HR drops down to 40's upon awakening.  Pt reports inability to walk on treadmill for fear that will pass out.  Today at grocery store pt felt like was going to pass out and had to hold on to daughter.  States that it feels as if heart is having a heart time pumping.  Pt concerned about mild mitral thickening.  Reports can hear heart murmur when auscultates heart sounds.  Also reports pressure throughout chest and bilateral neck.   Last heart monitor July 2022 pt reports symptoms have worsened since this time.  Will route to MD to address.

## 2021-06-20 NOTE — Telephone Encounter (Signed)
Pt this per Mychart message to the scheduling pool:         All questions are a no.......it's not pain. In my message i never mentioned pain. The only way I can describe it is it's pressure or a heaviness sensation. I can feel my palpitations more compared to before. I don't feel any better with the medication.   Good Morning [name],  Can you tell me a little more about you Chest pain?    1. Are you having CP right now?   2. Are you experiencing any other symptoms (ex. SOB, nausea, vomiting, sweating)?   3. How long have you been experiencing CP?   4. Is your CP continuous or coming and going?   5. Have you taken Nitroglycerin?  ?     Appointment Request From: Yolanda Hall  With Provider: Christell Constant, MD [facility] [address] Office]  Preferred Date Range: Any date 07/01/2021 or later  Preferred Times: Any Time  Reason for visit: Office Visit  Comments: No improvement with current medication regimen of lisinopril 2.5mg  q daily. Continue to have slight lightheadedness and pressure in my chest and neck, unable to sleep lying flat, and my palpitations seem have worsened.

## 2021-06-20 NOTE — Telephone Encounter (Signed)
Spoke with the patient who states that she does not think the lisinopril is helping. She states that she has not had any syncope or pre-syncope episodes. She reports that she is still lightheaded though. She also complains of heavy feeling in her chest along with palpitations when she lays down at night. She denies any chest pain. She states that it is just very uncomfortable. She has not been taking her HR or BP which I have advised her to start recording. She does feel like her heart rate jumps from bradycardia to tachycardia quickly. She also complains of being fatigued very easily. She states that she gets short of breath but only with exercise. She states that she cant push herself on the treadmill as much as she used to, however she is also scared to push herself because she does not want to pass out. Advised patient to ensure she is staying hydrated and to not push herself with exercise. Will send message to Dr. Izora Ribas for further advisement.

## 2021-06-21 NOTE — Telephone Encounter (Signed)
Called pt reviewed MD recommendation to wear a 7 day heart monitor.  Reviewed instructions and placed on my chart for pt to review.  Pt reports checking BP standing (107/76-100) and then sitting (109/70-61).  RN advised pt to check BP/ HR lying, sitting, standing and 3 min after standing.  Pt advised that she would also check BP while exercising.  All questions answered.

## 2021-06-22 ENCOUNTER — Ambulatory Visit (INDEPENDENT_AMBULATORY_CARE_PROVIDER_SITE_OTHER): Payer: Commercial Managed Care - PPO

## 2021-06-22 DIAGNOSIS — R002 Palpitations: Secondary | ICD-10-CM

## 2021-06-22 NOTE — Progress Notes (Unsigned)
Enrolled patient for a 7 day Zio XT monitor to be mailed to patients home   Initial 7 day zio xt [id number] fell off within 24 hours.  Patient instructed to mail back to [facility] for processing.  On 06/30/21 patient came into office to have REDO 7 day ZIO XT Monitor serial # [id number] applied in office using tincture of benzoin.  [employer] notified to cancel charges on first monitor and to assign new serial # to her enrollment. Message Acknowledged.

## 2021-06-25 DIAGNOSIS — R002 Palpitations: Secondary | ICD-10-CM

## 2021-06-29 ENCOUNTER — Telehealth: Payer: Self-pay | Admitting: Internal Medicine

## 2021-06-29 NOTE — Telephone Encounter (Signed)
Monitor fell off in 24 hours. Patient instructed to mail original monitor serial # [id number] back to [facility] for processing. Patient will come to [facility] office 06/30/21, 8:30 AM to have replacement monitor [id number] applied in office using tincture of benzoin. [employer] notified to cancel charges on first monitor and to update records with new serial #.

## 2021-06-29 NOTE — Telephone Encounter (Signed)
New Message:    Patient said she received the Monitor, but could not wear it. She could not get the monitor to stick. She called the company and they her to send it back and to notify you. She wants to know what she needs to do ?

## 2021-06-30 ENCOUNTER — Other Ambulatory Visit: Payer: Self-pay

## 2021-07-18 ENCOUNTER — Encounter: Payer: Self-pay | Admitting: Internal Medicine

## 2021-07-19 ENCOUNTER — Other Ambulatory Visit: Payer: Self-pay | Admitting: Internal Medicine

## 2021-07-19 MED ORDER — SYNTHROID 100 MCG PO TABS: 100.0000 ug | ORAL_TABLET | Freq: Every day | ORAL | 6 refills | Status: DC

## 2021-07-19 MED ORDER — LEVOTHYROXINE SODIUM 100 MCG PO TABS: 100.0000 ug | ORAL_TABLET | Freq: Every day | ORAL | 6 refills | Status: DC

## 2021-07-22 ENCOUNTER — Other Ambulatory Visit: Payer: Self-pay | Admitting: Internal Medicine

## 2021-07-22 MED ORDER — LEVOTHYROXINE SODIUM 100 MCG PO TABS: 100.0000 ug | ORAL_TABLET | Freq: Every day | ORAL | 3 refills | Status: DC

## 2021-07-22 NOTE — Progress Notes (Signed)
Pt request changing brand to generic due to cost

## 2021-08-10 ENCOUNTER — Ambulatory Visit: Payer: Commercial Managed Care - PPO | Admitting: Internal Medicine

## 2021-08-15 NOTE — Progress Notes (Deleted)
?Cardiology Office Note:   ? ?Date:  08/15/2021  ? ?ID:  Yolanda Hall, DOB [birth year], MRN [id number] ? ?PCP:  Philip Aspen, Limmie Patricia, MD ?  ?[facility] Providers ?Cardiologist:  Christell Constant, MD    ? ?Referring MD: Philip Aspen, Est[place]*  ? ?CC: PVC and PACs ? ?History of Present Illness:   ? ?Yolanda Hall is a 51 y.o. female with a hx of Grave's disease and hypothyroidism, who presents for evaluation 05/18/21.Since last visit had normal cardiac testing save for exercise induced hypertension on low dose ACEi.  She has had changed to her thyroid medication and feels much better. ? ?Patient notes that she is doing ***.   ?Since day prior/last visit notes *** . ?There are no*** interval hospital/ED visit.   ? ?No chest pain or pressure ***.  No SOB/DOE*** and no PND/Orthopnea***.  No weight gain or leg swelling***.  No palpitations or syncope ***. ? ?Ambulatory blood pressure ***. ? ? ? ? ?Past Medical History:  ?Diagnosis Date  ? Graves disease   ? Hypothyroidism   ? Palpitations   ? ? ?Past Surgical History:  ?Procedure Laterality Date  ? CESAREAN SECTION    ? SEPTOPLASTY    ? ? ?Current Medications: ?No outpatient medications have been marked as taking for the 08/18/21 encounter (Appointment) with Christell Constant, MD.  ?  ? ?Allergies:   Patient has no known allergies.  ? ?Social History  ? ?Socioeconomic History  ? Marital status: Married  ?  Spouse name: Not on file  ? Number of children: Not on file  ? Years of education: Not on file  ? Highest education level: Not on file  ?Occupational History  ? Not on file  ?Tobacco Use  ? Smoking status: Never  ? Smokeless tobacco: Never  ?Substance and Sexual Activity  ? Alcohol use: Never  ? Drug use: Never  ? Sexual activity: Not on file  ?Other Topics Concern  ? Not on file  ?Social History Narrative  ? Not on file  ? ?Social Determinants of Health  ? ?Financial Resource Strain: Not on file  ?Food Insecurity: Not on file   ?Transportation Needs: Not on file  ?Physical Activity: Not on file  ?Stress: Not on file  ?Social Connections: Not on file  ?  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 ?Family History: ?History of coronary artery disease notable for no members. ?History of heart failure notable for no members. ?History of arrhythmia notable for no members. ?Denies family history of sudden cardiac death including drowning, car accidents, or unexplained deaths in the family. ?No history of bicuspid aortic valve or aortic aneurysm or dissection.   ?No history of cardiomyopathies including hypertrophic cardiomyopathy, left ventricular non-compaction, or arrhythmogenic right ventricular cardiomyopathy. ? ?ROS:   ?Please see the history of present illness.    ? All other systems reviewed and are negative. ? ?EKGs/Labs/Other Studies Reviewed:   ? ?The following studies were reviewed today: ? ?EKG:   ?05/18/21: SR rate 66 WNL ?11/24/20:  SR 67 ? ?Cardiac Event Monitoring: ?Date: 12/21/20 ?Results: ?Sinus rhythm with average heart rate of 72 BPM. ?Rare PVC's and PAC's - occasional association with symptom of fluttering, although fluttering is also noted during sinus rhythm and sinus tachycardia ?No pauses, no atrial fibrillation. ?Reassuring monitor with no adverse arrhythmias ?  ? ?Recent Labs: ?02/11/2021: TSH 2.07 ?04/22/2021: ALT 19; Hemoglobin 14.5; Platelets 309.0 ?06/09/2021: BUN 21; Creatinine, Ser 0.82; Potassium 4.6; Sodium  140  ?Recent Lipid Panel ?   ?Component Value Date/Time  ? CHOL 162 04/22/2021 0835  ? TRIG 83.0 04/22/2021 0835  ? HDL 49.00 04/22/2021 0835  ? CHOLHDL 3 04/22/2021 0835  ? VLDL 16.6 04/22/2021 0835  ? [place] 96 04/22/2021 0835  ? ? ?Physical Exam:   ? ?VS:  There were no vitals taken for this visit.   ? ?Wt Readings from Last 3 Encounters:  ?05/18/21 172 lb (78 kg)  ?05/17/21 171 lb 1.6 oz (77.6 kg)  ?04/20/21 171 lb 3.2 oz (77.7 kg)  ?  ?Gen: *** distress, *** obese/well nourished/malnourished   ?Neck: No  JVD, *** carotid bruit ?Ears: Pilar Plate Sign ?Cardiac: No Rubs or Gallops, *** Murmur, ***cardia, *** radial pulses ?Respiratory: Clear to auscultation bilaterally, *** effort, ***  respiratory rate ?GI: Soft, nontender, non-distended *** ?MS: No *** edema; *** moves all extremities ?Integument: Skin feels *** ?Neuro:  At time of evaluation, alert and oriented to person/place/time/situation *** ?Psych: Normal affect, patient feels *** ? ? ? ?ASSESSMENT:   ? ?No diagnosis found. ? ?PLAN:   ? ?PACs/PVCs ?DOE ?Grave's Disease and hypothyroidism ?- *** ?- minimize coffee intake ?- one year ? ? ?Will plan for *** month follow up with ***APP or ***me/primary cardiologist unless new symptoms or abnormal test results warranting change in plan ? ? ? ?   ? ? ? ?Medication Adjustments/Labs and Tests Ordered: ?Current medicines are reviewed at length with the patient today.  Concerns regarding medicines are outlined above.  ?No orders of the defined types were placed in this encounter. ? ? ?No orders of the defined types were placed in this encounter. ? ? ? ?There are no Patient Instructions on file for this visit.  ? ?Signed, ?Werner Lean, MD  ?08/15/2021 8:58 AM    ?[place] ? ?

## 2021-08-18 ENCOUNTER — Ambulatory Visit: Payer: Commercial Managed Care - PPO | Admitting: Internal Medicine

## 2021-09-07 ENCOUNTER — Ambulatory Visit: Payer: Commercial Managed Care - PPO | Admitting: Internal Medicine

## 2021-10-12 ENCOUNTER — Encounter: Payer: Self-pay | Admitting: Internal Medicine

## 2021-10-12 ENCOUNTER — Other Ambulatory Visit: Payer: Self-pay | Admitting: Internal Medicine

## 2021-10-12 DIAGNOSIS — E890 Postprocedural hypothyroidism: Secondary | ICD-10-CM

## 2021-10-17 ENCOUNTER — Telehealth: Payer: Self-pay | Admitting: Internal Medicine

## 2021-10-17 NOTE — Telephone Encounter (Signed)
Per patient request, Dr. Rolene Course [facility] removed as endocrinologist 10/12/21 ?

## 2021-10-27 ENCOUNTER — Ambulatory Visit: Payer: Commercial Managed Care - PPO | Admitting: Internal Medicine

## 2021-10-31 ENCOUNTER — Ambulatory Visit: Payer: Commercial Managed Care - PPO | Admitting: Internal Medicine

## 2021-11-02 ENCOUNTER — Telehealth: Payer: Self-pay | Admitting: Internal Medicine

## 2021-11-02 NOTE — Telephone Encounter (Signed)
New Message:     Patient would like to switch from Dr Izora Ribas  to Dr Allyson Sabal. Is this alright with you  both?

## 2021-11-04 ENCOUNTER — Telehealth: Payer: Self-pay | Admitting: Cardiovascular Disease

## 2021-11-04 NOTE — Telephone Encounter (Signed)
Patient c/o Palpitations:  High priority if patient c/o lightheadedness, shortness of breath, or chest pain  How long have you had palpitations/irregular HR/ Afib? Are you having the symptoms now?   Are you currently experiencing lightheadedness, SOB or CP? Lightheadedness and SOB  Do you have a history of afib (atrial fibrillation) or irregular heart rhythm? She was told she has a diastolic murmur   Have you checked your BP or HR? (document readings if available): HR is getting down to 48 at night. States BP is ok, left arm diastolic pressure is always a little elevated. Right arm is normal. Ranging 110-115/75.   Are you experiencing any other symptoms? Lightheadedness and SOB occurring when she moves around to the point she has to sit down.   Patient is taking lisinopril twice a day instead of once as prescribed. States the increase is helping with headaches. Has not physically passed out, but feels as if she will if she doesn't stop and rest. Bradycardia and palpitations wake her up at night. States both happen all throughout the day, but she notices it more at night.

## 2021-11-04 NOTE — Telephone Encounter (Signed)
Received direct call from patient into triage with the below symptoms. Patient states that these symptoms have been gradually worsening over the last month but are not new for her. Patient had an appointment with Dr. Allyson Sabal next week but this had to be cancelled, patient is concerned about worsening dizziness, shortness of breath, and slow heart rate at night. Patient states that she does feel stable at the moment and feels okay to wait until next week to be seen. Scheduled patient an appointment with Dr. Servando Salina (DOD) on Wednesday 5/31 at 2:20. Made patient aware of ED precautions in the mean time should new or worsening symptoms develop. Patient verbalized understanding.   Will forward to MD to make aware.   Advised patient to call back to office with any issues, questions, or concerns. Patient verbalized understanding.

## 2021-11-08 ENCOUNTER — Ambulatory Visit: Payer: Commercial Managed Care - PPO | Admitting: Cardiovascular Disease

## 2021-11-09 ENCOUNTER — Ambulatory Visit (INDEPENDENT_AMBULATORY_CARE_PROVIDER_SITE_OTHER): Payer: Commercial Managed Care - PPO | Admitting: Cardiology

## 2021-11-09 ENCOUNTER — Encounter: Payer: Self-pay | Admitting: Cardiology

## 2021-11-09 VITALS — BP 116/86 | HR 107 | Ht 64.0 in | Wt 160.4 lb

## 2021-11-09 DIAGNOSIS — R0609 Other forms of dyspnea: Secondary | ICD-10-CM | POA: Diagnosis not present

## 2021-11-09 DIAGNOSIS — I10 Essential (primary) hypertension: Secondary | ICD-10-CM | POA: Diagnosis not present

## 2021-11-09 DIAGNOSIS — R42 Dizziness and giddiness: Secondary | ICD-10-CM

## 2021-11-09 DIAGNOSIS — Z79899 Other long term (current) drug therapy: Secondary | ICD-10-CM | POA: Diagnosis not present

## 2021-11-09 MED ORDER — METOPROLOL TARTRATE 100 MG PO TABS: ORAL_TABLET | ORAL | 0 refills | Status: DC

## 2021-11-09 NOTE — Patient Instructions (Addendum)
Medication Instructions:  Your physician recommends that you continue on your current medications as directed. Please refer to the Current Medication list given to you today.  *If you need a refill on your cardiac medications before your next appointment, please call your pharmacy*   Lab Work: Your physician recommends that you return for lab work in:  TODAY: [facility], [place] If you have labs (blood work) drawn today and your tests are completely normal, you will receive your results only by: MyChart Message (if you have [place]) OR A paper copy in the mail If you have any lab test that is abnormal or we need to change your treatment, we will call you to review the results.   Testing/Procedures: Your physician has requested that you have a carotid duplex. This test is an ultrasound of the carotid arteries in your neck. It looks at blood flow through these arteries that supply the brain with blood. Allow one hour for this exam. There are no restrictions or special instructions.    Your cardiac CT will be scheduled at one of the below locations:   [facility] [address] [place], [place] [address] [phone]   If scheduled at [facility], please arrive at the [facility] Entrance (Entrance C2) of [facility] 30 minutes prior to test start time. You can use the FREE valet parking offered at entrance C (encouraged to control the heart rate for the test)  Proceed to the [facility] Radiology Department (first floor) to check-in and test prep.  All radiology patients and guests should use entrance C2 at [facility], accessed from [facility], even though the hospital's physical address listed is [address].    Please follow these instructions carefully (unless otherwise directed):  On the Night Before the Test: Be sure to Drink plenty of water. Do not consume any caffeinated/decaffeinated beverages or chocolate 12  hours prior to your test. Do not take any antihistamines 12 hours prior to your test.   On the Day of the Test: Drink plenty of water until 1 hour prior to the test. Do not eat any food 4 hours prior to the test. You may take your regular medications prior to the test.  Take metoprolol (Lopressor) two hours prior to test. HOLD Furosemide/Hydrochlorothiazide morning of the test. FEMALES- please wear underwire-free bra if available, avoid dresses & tight clothing       After the Test: Drink plenty of water. After receiving IV contrast, you may experience a mild flushed feeling. This is normal. On occasion, you may experience a mild rash up to 24 hours after the test. This is not dangerous. If this occurs, you can take Benadryl 25 mg and increase your fluid intake. If you experience trouble breathing, this can be serious. If it is severe call 911 IMMEDIATELY. If it is mild, please call our office. If you take any of these medications: Metformin please do not take 48 hours after completing test unless otherwise instructed.  We will call to schedule your test 2-4 weeks out understanding that some insurance companies will need an authorization prior to the service being performed.   For non-scheduling related questions, please contact the cardiac imaging nurse navigator should you have any questions/concerns: Marchia Bond, Cardiac Imaging Nurse Navigator Gordy Clement, Cardiac Imaging Nurse Navigator [facility] Direct Office Dial: [phone]   For scheduling needs, including cancellations and rescheduling, please call [place], [phone].  Follow-Up: At [facility], you and your health needs are our priority.  As part of our continuing mission to provide you with exceptional heart care, we have created designated Provider Care Teams.  These Care Teams include your primary Cardiologist (physician) and Advanced Practice Providers (APPs -  Physician  Assistants and Nurse Practitioners) who all work together to provide you with the care you need, when you need it.  We recommend signing up for the patient portal called "MyChart".  Sign up information is provided on this After Visit Summary.  MyChart is used to connect with patients for Virtual Visits (Telemedicine).  Patients are able to view lab/test results, encounter notes, upcoming appointments, etc.  Non-urgent messages can be sent to your provider as well.   To learn more about what you can do with MyChart, go to [email or web address].    Your next appointment:   4 month(s)  The format for your next appointment:   In Person  Provider:   Berniece Salines, DO    Other Instructions [email or web address]        FDA-cleared, clinical grade mobile EKG monitor: Jodelle Red is the most clinically-validated mobile EKG used by the world's leading cardiac care medical professionals With Basic service, know instantly if your heart rhythm is normal or if atrial fibrillation is detected, and email the last single EKG recording to yourself or your doctor Premium service, available for purchase through the Kardia app for $9.99 per month or $99 per year, includes unlimited history and storage of your EKG recordings, a monthly EKG summary report to share with your doctor, along with the ability to track your blood pressure, activity and weight Includes one KardiaMobile phone clip FREE SHIPPING: Standard delivery 1-3 business days. Orders placed by 11:00am PST will ship that afternoon. Otherwise, will ship next business day. All orders ship via [employer] from [place], [place] - sending an EKG Download app and set up profile. Run EKG - by placing 1-2 fingers on the silver plates After EKG is complete - Download PDF  - Skip password (if you apply a password the provider will need it to view the EKG) Click share button (square with upward arrow)  in bottom left corner To send: choose MyChart (first time log into MyChart)  Pop up window about sending ECG Click continue Choose type of message Choose provider Type subject and message Click send (EKG should be attached)  - To send additional EKGs in one message click the paperclip image and bottom of page to attach.      Important Information About Sugar

## 2021-11-09 NOTE — Progress Notes (Signed)
Cardiology Office Note:    Date:  11/12/2021   ID:  Yolanda Hall, DOB Jun 01, 1971, MRN [id number]  PCP:  Yolanda Hall, Yolanda Patricia, MD  Cardiologist:  Yolanda Constant, MD  Electrophysiologist:  None   Referring MD: Yolanda Hall, Estel*   " I am short of breath and dizzy"  History of Present Illness:    Yolanda Hall is a 51 y.o. female with a hx of disease, hypothyroidism, who was previously seen by Dr. Izora Hall and was last seen by him in December 2022.  During that time an echocardiogram was done which showed mild thickening of the mitral valve with no stenosis or regurgitation, exercise tolerance test show exercise-induced hypertension and her ZIO monitor was normal.  She was started on lisinopril and tells me that her blood pressure has been doing a lot better.  But she has had some dizziness which she had prior to her presentation with Dr. Izora Hall.  She notes that she has been experiencing and no shortness of breath she tells me it is increasing.   Past Medical History:  Diagnosis Date   Graves disease    Hypothyroidism    Palpitations     Past Surgical History:  Procedure Laterality Date   CESAREAN SECTION     SEPTOPLASTY      Current Medications: Current Meds  Medication Sig   Cyanocobalamin (VITAMIN B-12 PO) Take by mouth daily.   liothyronine (CYTOMEL) 25 MCG tablet Take by mouth.   lisinopril (ZESTRIL) 2.5 MG tablet Take 1 tablet (2.5 mg total) by mouth daily. (Patient taking differently: Take 2.5 mg by mouth 2 (two) times daily.)   metFORMIN (GLUCOPHAGE) 850 MG tablet Take 850 mg by mouth daily.   metoprolol tartrate (LOPRESSOR) 100 MG tablet Take 2 hours prior to CT   Multiple Vitamin (MULTIVITAMIN PO) Take by mouth. Vitamin D , K , and A   Multiple Vitamin (MULTIVITAMIN) tablet Take 1 tablet by mouth daily.   PROMETRIUM 200 MG capsule Take 200 mg by mouth at bedtime.   Vitamin D, Ergocalciferol, (DRISDOL) 1.25 MG (50000  UNIT) CAPS capsule Take 50,000 Units by mouth once a week.     Allergies:   Patient has no known allergies.   Social History   Socioeconomic History   Marital status: Married    Spouse name: Not on file   Number of children: Not on file   Years of education: Not on file   Highest education level: Not on file  Occupational History   Not on file  Tobacco Use   Smoking status: Never   Smokeless tobacco: Never  Substance and Sexual Activity   Alcohol use: Never   Drug use: Never   Sexual activity: Not on file  Other Topics Concern   Not on file  Social History Narrative   Not on file   Social Determinants of Health   Financial Resource Strain: Not on file  Food Insecurity: Not on file  Transportation Needs: Not on file  Physical Activity: Not on file  Stress: Not on file  Social Connections: Not on file     Family History: The patient's family history is not on file.  ROS:   Review of Systems  Constitution: Negative for decreased appetite, fever and weight gain.  HENT: Negative for congestion, ear discharge, hoarse voice and sore throat.   Eyes: Negative for discharge, redness, vision loss in right eye and visual halos.  Cardiovascular: Negative for chest pain, dyspnea on exertion, leg  swelling, orthopnea and palpitations.  Respiratory: Negative for cough, hemoptysis, shortness of breath and snoring.   Endocrine: Negative for heat intolerance and polyphagia.  Hematologic/Lymphatic: Negative for bleeding problem. Does not bruise/bleed easily.  Skin: Negative for flushing, nail changes, rash and suspicious lesions.  Musculoskeletal: Negative for arthritis, joint pain, muscle cramps, myalgias, neck pain and stiffness.  Gastrointestinal: Negative for abdominal pain, bowel incontinence, diarrhea and excessive appetite.  Genitourinary: Negative for decreased libido, genital sores and incomplete emptying.  Neurological: Negative for brief paralysis, focal weakness, headaches  and loss of balance.  Psychiatric/Behavioral: Negative for altered mental status, depression and suicidal ideas.  Allergic/Immunologic: Negative for HIV exposure and persistent infections.    EKGs/Labs/Other Studies Reviewed:    The following studies were reviewed today:   HYQ:MVHQ today   Exercise tolerance test  06/2020  Exercise capacity was normal. Patient exercised for 9 min and 0 sec. Maximum HR of 162 bpm. MPHR 96.0 %. Peak METS 10.1 .   No ST deviation was noted.   Normal blood pressure and normal heart rate response noted during stress. Heart rate recovery was normal.   The ECG was negative for ischemia. This is a low risk study.  Zio monitor 06/2021 Patient had a minimum heart rate of 45 bpm, maximum heart rate of 151 bpm, and average heart rate of 78 bpm. Predominant underlying rhythm was sinus rhythm. Isolated PACs were rare (<1.0%). Isolated PVCs were rare (<1.0%). Triggered and diary events associated with sinus rhythm and sinus tachycardia.   Two monitors reviewed. No malignant arrhythmias.  TTE 06/2021 IMPRESSIONS   1. Left ventricular ejection fraction, by estimation, is 65 to 70%. Left  ventricular ejection fraction by 3D volume is 68 %. The left ventricle has  normal function. The left ventricle has no regional wall motion  abnormalities. Left ventricular diastolic   parameters are consistent with Grade I diastolic dysfunction (impaired  relaxation). The average left ventricular global longitudinal strain is  -22.4 %. The global longitudinal strain is normal.   2. Right ventricular systolic function is low normal. The right  ventricular size is normal. Tricuspid regurgitation signal is inadequate  for assessing PA pressure.   3. The mitral valve is abnormal. Trivial mitral valve regurgitation.   4. The aortic valve is tricuspid. Aortic valve regurgitation is not  visualized.   5. The inferior vena cava is normal in size with greater than 50%  respiratory  variability, suggesting right atrial pressure of 3 mmHg.   Recent Labs: 02/11/2021: TSH 2.07 04/22/2021: ALT 19; Hemoglobin 14.5; Platelets 309.0 06/09/2021: BUN 21; Creatinine, Ser 0.82; Potassium 4.6; Sodium 140  Recent Lipid Panel    Component Value Date/Time   CHOL 162 04/22/2021 0835   TRIG 83.0 04/22/2021 0835   HDL 49.00 04/22/2021 0835   CHOLHDL 3 04/22/2021 0835   VLDL 16.6 04/22/2021 0835   LDLCALC 96 04/22/2021 0835    Physical Exam:    VS:  BP 116/86   Pulse (!) 107   Ht  (1.626 m)   Wt 160 lb 6.4 oz (72.8 kg)   SpO2 96%   BMI 27.53 kg/m     Wt Readings from Last 3 Encounters:  11/09/21 160 lb 6.4 oz (72.8 kg)  05/18/21 172 lb (78 kg)  05/17/21 171 lb 1.6 oz (77.6 kg)     GEN: Well nourished, well developed in no acute distress HEENT: Normal NECK: No JVD; No carotid bruits LYMPHATICS: No lymphadenopathy CARDIAC: S1S2 noted,RRR, no murmurs, rubs, gallops  RESPIRATORY:  Clear to auscultation without rales, wheezing or rhonchi  ABDOMEN: Soft, non-tender, non-distended, +bowel sounds, no guarding. EXTREMITIES: No edema, No cyanosis, no clubbing MUSCULOSKELETAL:  No deformity  SKIN: Warm and dry NEUROLOGIC:  Alert and oriented x 3, non-focal PSYCHIATRIC:  Normal affect, good insight  ASSESSMENT:    1. Dizziness   2. DOE (dyspnea on exertion)   3. Medication management   4. Primary hypertension    PLAN:     Dyspnea on exertion is worsening.  We will like to get a coronary CT scan for this patient to make sure rule out any coronary atherosclerosis source of her worsening dyspnea.  Her dizziness I do suspect is noncardiac.  I like to refer the patient to ENT to be evaluated.  Since her recent monitor was normal, I suggested the use of kardia mobile to continue to monitor her heart rhythm during the time of symptoms.  We will get bilateral carotid ultrasound as well.  Her blood pressure in the office today is acceptable.  The patient is in agreement  with the above plan. The patient left the office in stable condition.  The patient will follow up in    Medication Adjustments/Labs and Tests Ordered: Current medicines are reviewed at length with the patient today.  Concerns regarding medicines are outlined above.  Orders Placed This Encounter  Procedures   CT CORONARY MORPH W/CTA COR W/SCORE W/CA W/CM &/OR WO/CM   Basic Metabolic Panel (BMET)   Magnesium   Ambulatory referral to ENT   VAS US CAROTID   Meds ordered this encounter  Medications   metoprolol tartrate (LOPRESSOR) 100 MG tablet    Sig: Take 2 hours prior to CT    Dispense:  1 tablet    Refill:  0    Patient Instructions  Medication Instructions:  Your physician recommends that you continue on your current medications as directed. Please refer to the Current Medication list given to you today.  *If you need a refill on your cardiac medications before your next appointment, please call your pharmacy*   Lab Work: Your physician recommends that you return for lab work in:  TODAY: BMET, Mag If you have labs (blood work) drawn today and your tests are completely normal, you will receive your results only by: MyChart Message (if you have MyChart) OR A paper copy in the mail If you have any lab test that is abnormal or we need to change your treatment, we will call you to review the results.   Testing/Procedures: Your physician has requested that you have a carotid duplex. This test is an ultrasound of the carotid arteries in your neck. It looks at blood flow through these arteries that supply the brain with blood. Allow one hour for this exam. There are no restrictions or special instructions.    Your cardiac CT will be scheduled at one of the below locations:   [facility] [address] [place], [place] [address] [phone]   If scheduled at [facility], please arrive at the [facility] and Children's Entrance (Entrance C2) of [facility] 30 minutes prior to test start time. You can use the FREE valet parking offered at entrance C (encouraged to control the heart rate for the test)  Proceed to the [facility] Radiology Department (first floor) to check-in and test prep.  All radiology patients and guests should use entrance C2 at [facility], accessed from [facility], even though the  hospital's physical address listed is [address]    Please follow these instructions carefully (unless otherwise directed):  On the Night Before the Test: Be sure to Drink plenty of water. Do not consume any caffeinated/decaffeinated beverages or chocolate 12 hours prior to your test. Do not take any antihistamines 12 hours prior to your test.   On the Day of the Test: Drink plenty of water until 1 hour prior to the test. Do not eat any food 4 hours prior to the test. You may take your regular medications prior to the test.  Take metoprolol (Lopressor) two hours prior to test. HOLD Furosemide/Hydrochlorothiazide morning of the test. FEMALES- please wear underwire-free bra if available, avoid dresses & tight clothing       After the Test: Drink plenty of water. After receiving IV contrast, you may experience a mild flushed feeling. This is normal. On occasion, you may experience a mild rash up to 24 hours after the test. This is not dangerous. If this occurs, you can take Benadryl 25 mg and increase your fluid intake. If you experience trouble breathing, this can be serious. If it is severe call 911 IMMEDIATELY. If it is mild, please call our office. If you take any of these medications: Metformin please do not take 48 hours after completing test unless otherwise instructed.  We will call to schedule your test 2-4 weeks out understanding that some insurance companies will need an authorization prior to the service being performed.   For non-scheduling related questions, please contact the cardiac  imaging nurse navigator should you have any questions/concerns: Rockwell AlexandriaSara Wallace, Cardiac Imaging Nurse Navigator Larey BrickMerle Prescott, Cardiac Imaging Nurse Navigator [facility] Direct Office Dial: [phone]   For scheduling needs, including cancellations and rescheduling, please call [place], [phone].    Follow-Up: At [facility], you and your health needs are our priority.  As part of our continuing mission to provide you with exceptional heart care, we have created designated Provider Care Teams.  These Care Teams include your primary Cardiologist (physician) and Advanced Practice Providers (APPs -  Physician Assistants and Nurse Practitioners) who all work together to provide you with the care you need, when you need it.  We recommend signing up for the patient portal called "MyChart".  Sign up information is provided on this After Visit Summary.  MyChart is used to connect with patients for Virtual Visits (Telemedicine).  Patients are able to view lab/test results, encounter notes, upcoming appointments, etc.  Non-urgent messages can be sent to your provider as well.   To learn more about what you can do with MyChart, go to [email or web address].    Your next appointment:   4 month(s)  The format for your next appointment:   In Person  Provider:   Thomasene RippleKardie Dailon Sheeran, DO    Other Instructions KardiaMobile [email or web address]        FDA-cleared, clinical grade mobile EKG monitor: Lourena SimmondsKardia is the most clinically-validated mobile EKG used by the world's leading cardiac care medical professionals With Basic service, know instantly if your heart rhythm is normal or if atrial fibrillation is detected, and email the last single EKG recording to yourself or your doctor Premium service, available for purchase through the Kardia app for $9.99 per month or $99 per year, includes unlimited history and storage of your EKG recordings, a  monthly EKG summary report to share with your doctor, along with the ability to track your blood pressure, activity and weight Includes one  KardiaMobile phone clip FREE SHIPPING: Standard delivery 1-3 business days. Orders placed by 11:00am PST will ship that afternoon. Otherwise, will ship next business day. All orders ship via [employer] from [place], [place]    [employer] - sending an EKG Download app and set up profile. Run EKG - by placing 1-2 fingers on the silver plates After EKG is complete - Download PDF  - Skip password (if you apply a password the provider will need it to view the EKG) Click share button (square with upward arrow) in bottom left corner To send: choose MyChart (first time log into MyChart)  Pop up window about sending ECG Click continue Choose type of message Choose provider Type subject and message Click send (EKG should be attached)  - To send additional EKGs in one message click the paperclip image and bottom of page to attach.      Important Information About Sugar         Adopting a Healthy Lifestyle.  Know what a healthy weight is for you (roughly BMI <25) and aim to maintain this   Aim for 7+ servings of fruits and vegetables daily   65-80+ fluid ounces of water or unsweet tea for healthy kidneys   Limit to max 1 drink of alcohol per day; avoid smoking/tobacco   Limit animal fats in diet for cholesterol and heart health - choose grass fed whenever available   Avoid highly processed foods, and foods high in saturated/trans fats   Aim for low stress - take time to unwind and care for your mental health   Aim for 150 min of moderate intensity exercise weekly for heart health, and weights twice weekly for bone health   Aim for 7-9 hours of sleep daily   When it comes to diets, agreement about the perfect plan isnt easy to find, even among the experts. Experts at the [facility] [employer] developed an idea known as the  Healthy Eating Plate. Just imagine a plate divided into logical, healthy portions.   The emphasis is on diet quality:   Load up on vegetables and fruits - one-half of your plate: Aim for color and variety, and remember that potatoes dont count.   Go for whole grains - one-quarter of your plate: Whole wheat, barley, wheat berries, quinoa, oats, brown rice, and foods made with them. If you want pasta, go with whole wheat pasta.   Protein power - one-quarter of your plate: Fish, chicken, beans, and nuts are all healthy, versatile protein sources. Limit red meat.   The diet, however, does go beyond the plate, offering a few other suggestions.   Use healthy plant oils, such as olive, canola, soy, corn, sunflower and peanut. Check the labels, and avoid partially hydrogenated oil, which have unhealthy trans fats.   If youre thirsty, drink water. Coffee and tea are good in moderation, but skip sugary drinks and limit milk and dairy products to one or two daily servings.   The type of carbohydrate in the diet is more important than the amount. Some sources of carbohydrates, such as vegetables, fruits, whole grains, and beans-are healthier than others.   Finally, stay active  Signed, Thomasene Ripple, DO  11/12/2021 7:09 PM    [facility]

## 2021-11-10 ENCOUNTER — Ambulatory Visit: Payer: Commercial Managed Care - PPO | Admitting: Physician Assistant

## 2021-11-23 LAB — MAGNESIUM: Magnesium: 2.2 mg/dL (ref 1.6–2.3)

## 2021-11-23 LAB — BASIC METABOLIC PANEL
BUN/Creatinine Ratio: 22 (ref 9–23)
BUN: 17 mg/dL (ref 6–24)
CO2: 26 mmol/L (ref 20–29)
Calcium: 9.5 mg/dL (ref 8.7–10.2)
Chloride: 102 mmol/L (ref 96–106)
Creatinine, Ser: 0.77 mg/dL (ref 0.57–1.00)
Glucose: 93 mg/dL (ref 70–99)
Potassium: 4.2 mmol/L (ref 3.5–5.2)
Sodium: 140 mmol/L (ref 134–144)
eGFR: 92 mL/min/{1.73_m2} (ref >59)

## 2021-11-25 ENCOUNTER — Encounter (HOSPITAL_COMMUNITY): Payer: Commercial Managed Care - PPO

## 2021-11-29 ENCOUNTER — Ambulatory Visit (HOSPITAL_COMMUNITY)
Admission: RE | Admit: 2021-11-29 | Discharge: 2021-11-29 | Disposition: A | Discharge status: Home / Self Care | Payer: Commercial Managed Care - PPO | Source: Ambulatory Visit | Attending: Cardiovascular Disease | Admitting: Cardiovascular Disease

## 2021-11-29 DIAGNOSIS — R42 Dizziness and giddiness: Secondary | ICD-10-CM | POA: Diagnosis present

## 2021-12-06 ENCOUNTER — Encounter: Payer: Self-pay | Admitting: Cardiology

## 2021-12-06 MED ORDER — LISINOPRIL 2.5 MG PO TABS: 2.5000 mg | ORAL_TABLET | Freq: Two times a day (BID) | ORAL | 1 refills | Status: DC

## 2021-12-07 ENCOUNTER — Other Ambulatory Visit (HOSPITAL_COMMUNITY): Payer: Commercial Managed Care - PPO

## 2022-01-11 ENCOUNTER — Telehealth (HOSPITAL_COMMUNITY): Payer: Self-pay | Admitting: *Deleted

## 2022-01-11 NOTE — Telephone Encounter (Signed)
Patient calling about her upcoming cardiac imaging study; pt verbalizes understanding of appt date/time, parking situation and where to check in, pre-test NPO status and medications ordered, and verified current allergies; name and call back number provided for further questions should they arise  Larey Brick RN Navigator Cardiac Imaging [facility] [phone] office [phone] cell  Patient to take 100mg  metoprolol tartrate two hours prior to her cardiac CT scan. She is aware to arrive at 3:30pm.

## 2022-01-12 ENCOUNTER — Ambulatory Visit (HOSPITAL_COMMUNITY)
Admission: RE | Admit: 2022-01-12 | Discharge: 2022-01-12 | Disposition: A | Discharge status: Home / Self Care | Payer: Commercial Managed Care - PPO | Source: Ambulatory Visit | Attending: Cardiology | Admitting: Cardiology

## 2022-01-12 DIAGNOSIS — R0609 Other forms of dyspnea: Secondary | ICD-10-CM | POA: Diagnosis not present

## 2022-01-12 DIAGNOSIS — R9430 Abnormal result of cardiovascular function study, unspecified: Secondary | ICD-10-CM

## 2022-01-12 MED ORDER — IOHEXOL 350 MG/ML SOLN
100.0000 mL | Freq: Once | INTRAVENOUS | Status: AC | PRN
  Administered 2022-01-12: 100 mL via INTRAVENOUS

## 2022-01-12 MED ORDER — NITROGLYCERIN 0.4 MG SL SUBL
0.8000 mg | SUBLINGUAL_TABLET | Freq: Once | SUBLINGUAL | Status: AC
  Administered 2022-01-12: 0.8 mg via SUBLINGUAL

## 2022-01-12 MED ORDER — NITROGLYCERIN 0.4 MG SL SUBL
SUBLINGUAL_TABLET | SUBLINGUAL | Status: AC
  Filled 2022-01-12: qty 2

## 2022-01-17 ENCOUNTER — Ambulatory Visit: Payer: Commercial Managed Care - PPO | Admitting: Internal Medicine

## 2022-01-19 ENCOUNTER — Encounter: Payer: Self-pay | Admitting: Family Medicine

## 2022-01-19 ENCOUNTER — Ambulatory Visit (INDEPENDENT_AMBULATORY_CARE_PROVIDER_SITE_OTHER): Payer: Commercial Managed Care - PPO | Admitting: Family Medicine

## 2022-01-19 DIAGNOSIS — B029 Zoster without complications: Secondary | ICD-10-CM | POA: Diagnosis not present

## 2022-01-19 MED ORDER — VALACYCLOVIR HCL 1 G PO TABS: 1000.0000 mg | ORAL_TABLET | Freq: Two times a day (BID) | ORAL | 0 refills | Status: AC

## 2022-01-19 NOTE — Assessment & Plan Note (Signed)
Patient is within the 72 hour window for treatment with valacyclovir. I have sent the script to her pharmacy. I offered her gabapentin for the pain however she declined. I advised she continue to wear gloves to reduce the transmission of the virus to others in the household.

## 2022-01-19 NOTE — Progress Notes (Signed)
   Established Patient Office Visit  Subjective   Patient ID: Yolanda Hall, female    DOB: Aug 25, 1970  Age: 51 y.o. MRN: [id number]  Chief Complaint  Patient presents with   Insect Bite    Patient states she noticed a questionable bug bite palmar aspect of the right hand 3 days ago, streaks noted along the 4th-5th digits this morning    Patient reports 2-3 day history of a blister on her right palm of her hand. States that it was itchy initially then it spread the space between her 4th and 5th finger and then she noticed red streaking up her back of her hand. It became more painful and she came in to have it checked out. Reports no fever/chills. Thought initially she was bitten by an insect but it had started in the middle of the night.       Review of Systems  All other systems reviewed and are negative.     Objective:     BP 98/64 (BP Location: Left Arm, Patient Position: Sitting, Cuff Size: Normal)   Pulse 83   Temp 98 F (36.7 C) (Oral)   Ht 5\' 4"  (1.626 m)   Wt 171 lb 12.8 oz (77.9 kg)   SpO2 98%   BMI 29.49 kg/m    Physical Exam Skin:    Findings: Rash present. Rash is vesicular.          Comments: Right hand lateral aspect of the palm there is a larger fluid filled vesicle. There is another grouping of vesicles between the 4th and 5th finger, there is trace edema around this area and erythema on the lateral portion of her right hand      No results found for any visits on 01/19/22.    The 10-year ASCVD risk score (Arnett DK, et al., 2019) is: 1.1%    Assessment & Plan:   Problem List Items Addressed This Visit       Other   Shingles    Patient is within the 72 hour window for treatment with valacyclovir. I have sent the script to her pharmacy. I offered her gabapentin for the pain however she declined. I advised she continue to wear gloves to reduce the transmission of the virus to others in the household.       Relevant Medications    valACYclovir (VALTREX) 1000 MG tablet    No follow-ups on file.    2020, MD

## 2022-03-01 ENCOUNTER — Ambulatory Visit: Payer: Commercial Managed Care - PPO | Admitting: Internal Medicine

## 2022-03-15 ENCOUNTER — Ambulatory Visit: Payer: Commercial Managed Care - PPO | Admitting: Cardiology

## 2022-04-27 ENCOUNTER — Encounter: Payer: Self-pay | Admitting: Internal Medicine

## 2022-04-27 ENCOUNTER — Ambulatory Visit (INDEPENDENT_AMBULATORY_CARE_PROVIDER_SITE_OTHER): Payer: Commercial Managed Care - PPO | Admitting: Internal Medicine

## 2022-04-27 VITALS — BP 108/78 | HR 67 | Temp 98.1°F | Ht 65.0 in | Wt 172.9 lb

## 2022-04-27 DIAGNOSIS — E890 Postprocedural hypothyroidism: Secondary | ICD-10-CM

## 2022-04-27 DIAGNOSIS — R7302 Impaired glucose tolerance (oral): Secondary | ICD-10-CM

## 2022-04-27 DIAGNOSIS — Z0000 Encounter for general adult medical examination without abnormal findings: Secondary | ICD-10-CM | POA: Diagnosis not present

## 2022-04-27 NOTE — Progress Notes (Signed)
Established Patient Office Visit     CC/Reason for Visit: Needs labs and biometric form for work  HPI: Chantrice Hagg is a 51 y.o. female who is coming in today for the above mentioned reasons. Past Medical History is significant for: Post ablative hypothyroidism, PVCs.  She is mainly followed by [facility].  They are following her thyroid.  She is here today because she needs a form filled out for insurance purposes that includes labs and biometric data.  She has routine eye and dental care.  She is overdue for cervical and breast cancer screening, colon cancer screening is up-to-date.   Past Medical/Surgical History: Past Medical History:  Diagnosis Date   Graves disease    Hypothyroidism    Palpitations     Past Surgical History:  Procedure Laterality Date   CESAREAN SECTION     SEPTOPLASTY      Social History:  reports that she has never smoked. She has never used smokeless tobacco. She reports that she does not drink alcohol and does not use drugs.  Allergies: No Known Allergies  Family History:  No history of heart disease, cancer, stroke that she is aware of   Current Outpatient Medications:    fexofenadine (ALLEGRA) 180 MG tablet, Take 180 mg by mouth daily., Disp: , Rfl:    lisinopril (ZESTRIL) 2.5 MG tablet, Take 1 tablet (2.5 mg total) by mouth 2 (two) times daily., Disp: 180 tablet, Rfl: 1   Multiple Vitamin (MULTIVITAMIN PO), Take by mouth. Vitamin D , K , and A, Disp: , Rfl:    NP THYROID 15 MG tablet, Take 15 mg by mouth every morning., Disp: , Rfl:    NP THYROID 60 MG tablet, Take 60 mg by mouth every morning., Disp: , Rfl:    PROMETRIUM 200 MG capsule, Take 200 mg by mouth at bedtime., Disp: , Rfl:    triamcinolone (NASACORT) 55 MCG/ACT AERO nasal inhaler, Place 2 sprays into the nose daily., Disp: , Rfl:   Review of Systems:  Constitutional: Denies fever, chills, diaphoresis, appetite change and fatigue.  HEENT: Denies  photophobia, eye pain, redness, hearing loss, ear pain, congestion, sore throat, rhinorrhea, sneezing, mouth sores, trouble swallowing, neck pain, neck stiffness and tinnitus.   Respiratory: Denies SOB, DOE, cough, chest tightness,  and wheezing.   Cardiovascular: Denies chest pain, palpitations and leg swelling.  Gastrointestinal: Denies nausea, vomiting, abdominal pain, diarrhea, constipation, blood in stool and abdominal distention.  Genitourinary: Denies dysuria, urgency, frequency, hematuria, flank pain and difficulty urinating.  Endocrine: Denies: hot or cold intolerance, sweats, changes in hair or nails, polyuria, polydipsia. Musculoskeletal: Denies myalgias, back pain, joint swelling, arthralgias and gait problem.  Skin: Denies pallor, rash and wound.  Neurological: Denies dizziness, seizures, syncope, weakness, light-headedness, numbness and headaches.  Hematological: Denies adenopathy. Easy bruising, personal or family bleeding history  Psychiatric/Behavioral: Denies suicidal ideation, mood changes, confusion, nervousness, sleep disturbance and agitation    Physical Exam: Vitals:   04/27/22 0805  BP: 108/78  Pulse: 67  Temp: 98.1 F (36.7 C)  TempSrc: Oral  SpO2: 98%  Weight: 172 lb 14.4 oz (78.4 kg)  Height: 5\' 5"  (1.651 m)    Body mass index is 28.77 kg/m.   Constitutional: NAD, calm, comfortable Eyes: PERRL, lids and conjunctivae normal ENMT: Mucous membranes are moist. Posterior pharynx clear of any exudate or lesions. Normal dentition. Tympanic membrane is pearly white, no erythema or bulging. Neck: normal, supple, no masses, no thyromegaly Respiratory:  clear to auscultation bilaterally, no wheezing, no crackles. Normal respiratory effort. No accessory muscle use.  Cardiovascular: Regular rate and rhythm, no murmurs / rubs / gallops. No extremity edema. 2+ pedal pulses. No carotid bruits.  Abdomen: no tenderness, no masses palpated. No hepatosplenomegaly. Bowel  sounds positive.  Musculoskeletal: no clubbing / cyanosis. No joint deformity upper and lower extremities. Good ROM, no contractures. Normal muscle tone.  Skin: no rashes, lesions, ulcers. No induration Neurologic: CN 2-12 grossly intact. Sensation intact, DTR normal. Strength 5/5 in all 4.  Psychiatric: Normal judgment and insight. Alert and oriented x 3. Normal mood.   Flowsheet Row Office Visit from 04/27/2022 in [facility] [place]  PHQ-9 Total Score 0         Impression and Plan:  Encounter for preventive health examination - Plan: CBC with Differential/Platelet, Comprehensive metabolic panel, Lipid panel, VITAMIN D 25 Hydroxy (Vit-D Deficiency, Fractures)  Postablative hypothyroidism - Plan: TSH  IGT (impaired glucose tolerance) - Plan: Hemoglobin A1c   -Recommend routine eye and dental care. -Immunizations: She declines all immunizations despite counseling -Healthy lifestyle discussed in detail. -Labs to be updated today. -Colon cancer screening: 06/2018 -Breast cancer screening: Overdue, she states she will schedule -Cervical cancer screening: Overdue, she states she will schedule -Lung cancer screening: Not applicable -Prostate cancer screening: Not applicable -DEXA: Not applicable    Patient Instructions  -Nice seeing you today!!  -Lab work today; will notify you once results are available.  -Schedule follow up in 1 year    Adger Cantera Philip Aspen, MD [facility]

## 2022-04-27 NOTE — Patient Instructions (Signed)
-  Nice seeing you today!!  -Lab work today; will notify you once results are available.  -Schedule follow up in 1 year. 

## 2022-05-12 ENCOUNTER — Other Ambulatory Visit (INDEPENDENT_AMBULATORY_CARE_PROVIDER_SITE_OTHER): Payer: Commercial Managed Care - PPO

## 2022-05-12 DIAGNOSIS — R7302 Impaired glucose tolerance (oral): Secondary | ICD-10-CM

## 2022-05-12 DIAGNOSIS — Z0000 Encounter for general adult medical examination without abnormal findings: Secondary | ICD-10-CM | POA: Diagnosis not present

## 2022-05-12 DIAGNOSIS — E890 Postprocedural hypothyroidism: Secondary | ICD-10-CM

## 2022-05-12 LAB — CBC WITH DIFFERENTIAL/PLATELET
Basophils Absolute: 0.1 10*3/uL (ref 0.0–0.1)
Basophils Relative: 1.2 % (ref 0.0–3.0)
Eosinophils Absolute: 0.3 10*3/uL (ref 0.0–0.7)
Eosinophils Relative: 5.7 % — ABNORMAL HIGH (ref 0.0–5.0)
HCT: 45.1 % (ref 36.0–46.0)
Hemoglobin: 15 g/dL (ref 12.0–15.0)
Lymphocytes Relative: 35.5 % (ref 12.0–46.0)
Lymphs Abs: 2 10*3/uL (ref 0.7–4.0)
MCHC: 33.3 g/dL (ref 30.0–36.0)
MCV: 94.9 fl (ref 78.0–100.0)
Monocytes Absolute: 0.4 10*3/uL (ref 0.1–1.0)
Monocytes Relative: 6.9 % (ref 3.0–12.0)
Neutro Abs: 2.8 10*3/uL (ref 1.4–7.7)
Neutrophils Relative %: 50.7 % (ref 43.0–77.0)
Platelets: 347 10*3/uL (ref 150.0–400.0)
RBC: 4.76 Mil/uL (ref 3.87–5.11)
RDW: 12.5 % (ref 11.5–15.5)
WBC: 5.6 10*3/uL (ref 4.0–10.5)

## 2022-05-12 LAB — COMPREHENSIVE METABOLIC PANEL
ALT: 26 U/L (ref 0–35)
AST: 17 U/L (ref 0–37)
Albumin: 4.5 g/dL (ref 3.5–5.2)
Alkaline Phosphatase: 81 U/L (ref 39–117)
BUN: 18 mg/dL (ref 6–23)
CO2: 29 mEq/L (ref 19–32)
Calcium: 9.4 mg/dL (ref 8.4–10.5)
Chloride: 102 mEq/L (ref 96–112)
Creatinine, Ser: 0.85 mg/dL (ref 0.40–1.20)
GFR: 78.1 mL/min (ref >60.00)
Glucose, Bld: 99 mg/dL (ref 70–99)
Potassium: 4.1 mEq/L (ref 3.5–5.1)
Sodium: 138 mEq/L (ref 135–145)
Total Bilirubin: 0.4 mg/dL (ref 0.2–1.2)
Total Protein: 7.5 g/dL (ref 6.0–8.3)

## 2022-05-12 LAB — TSH: TSH: 5.63 u[IU]/mL — ABNORMAL HIGH (ref 0.35–5.50)

## 2022-05-12 LAB — HEMOGLOBIN A1C: Hgb A1c MFr Bld: 6.1 % (ref 4.6–6.5)

## 2022-05-12 LAB — LIPID PANEL
Cholesterol: 195 mg/dL (ref 0–200)
HDL: 61.8 mg/dL (ref >39.00)
LDL Cholesterol: 118 mg/dL — ABNORMAL HIGH (ref 0–99)
NonHDL: 132.74
Total CHOL/HDL Ratio: 3
Triglycerides: 74 mg/dL (ref 0.0–149.0)
VLDL: 14.8 mg/dL (ref 0.0–40.0)

## 2022-05-12 LAB — VITAMIN D 25 HYDROXY (VIT D DEFICIENCY, FRACTURES): VITD: 47.22 ng/mL (ref 30.00–100.00)

## 2022-05-16 ENCOUNTER — Other Ambulatory Visit: Payer: Self-pay | Admitting: *Deleted

## 2022-05-16 DIAGNOSIS — R7302 Impaired glucose tolerance (oral): Secondary | ICD-10-CM

## 2022-07-16 ENCOUNTER — Other Ambulatory Visit: Payer: Self-pay | Admitting: Cardiology

## 2022-07-31 ENCOUNTER — Other Ambulatory Visit: Payer: Self-pay | Admitting: Cardiovascular Disease

## 2022-11-20 NOTE — Progress Notes (Unsigned)
Cardiology Clinic Note   Date: 11/22/2022 ID: Yolanda Hall, DOB 06-05-1971, MRN [id number]  Primary Cardiologist:  Thomasene Ripple, DO  Patient Profile    Yolanda Hall is a 52 y.o. female who presents to the clinic today for routine follow up.     Past medical history significant for: Palpitations. Exercise stress test 05/24/2021: Showed normal exercise capacity with normal blood pressure/heart rate response during stress.  Heart rate recovery was normal.  No ST deviation noted.  Negative for ischemia.  Low risk study. Echo 05/24/2021: EF 65 to 70%.  Grade I DD.  Low normal RV function.  Mild thickening of the mitral valve without stenosis or regurgitation. Cardiac event monitor 07/12/2021: Min HR 45 bpm, max HR 151 bpm, average HR 78 bpm.  Predominant underlying rhythm was sinus.  Isolated PACs/PVCs.  Triggered and diary events associated with sinus rhythm and sinus tachycardia.  No malignant arrhythmias. Dizziness/near syncope. Carotid ultrasound 11/29/2021: No evidence of stenosis bilateral ICA. Hypertension. Graves' disease. Hypothyroidism.     History of Present Illness    Yolanda Hall was first evaluated by Dr. Izora Ribas on 05/18/2021 for palpitations at the request of Dr. Philip Aspen.  She reported DOE and feeling unstable on her feet but no chest pain.  Echo and POET were ordered and she was trialed on diltiazem as needed palpitations.  Echo and treadmill stress test were essentially normal (details above).  She was seen in the office by Dr. Servando Salina on 11/09/2021 with complaints of worsening dyspnea on exertion and continued dizziness.  Carotid ultrasound was ordered to evaluate dizziness and she was referred to ENT.  Coronary CTA showed calcium score of 0.  Today, patient is doing well overall. Patient denies shortness of breath or dyspnea on exertion. No chest pain, pressure, or tightness. Denies lower extremity edema, orthopnea, or PND. She reports  increased palpitations that wake her up around 3 am. Palpitations are described as racing with fluttering that feels slightly different but she is unsure how to describe it. She does feel mildly anxious. She checks her heart rate during episodes and it is typically 80-90 bpm but she feels that is high for her, as her heart rate can dip into the high 40s when she is sleeping. She is active cycling for 30 minutes and walking at an incline on the treadmill for 10-15 minutes 2-3 times a week.    ROS: All other systems reviewed and are otherwise negative except as noted in History of Present Illness.  Studies Reviewed    ECG personally reviewed by me today: NSR, 68 bpm.  No significant changes from 05/18/2021.          Physical Exam    VS:  BP 104/72   Pulse 68   Ht 5' 4.5" (1.638 m)   Wt 173 lb 6.4 oz (78.7 kg)   LMP 10/10/2017 (Exact Date)   SpO2 95%   BMI 29.30 kg/m  , BMI Body mass index is 29.3 kg/m.  GEN: Well nourished, well developed, in no acute distress. Neck: No JVD or carotid bruits. Cardiac:  RRR. No murmurs. No rubs or gallops.   Respiratory:  Respirations regular and unlabored. Clear to auscultation without rales, wheezing or rhonchi. GI: Soft, nontender, nondistended. Extremities: Radials/DP/PT 2+ and equal bilaterally. No clubbing or cyanosis. No edema.  Skin: Warm and dry, no rash. Neuro: Strength intact.  Assessment & Plan   Palpitations.  Cardiac event monitor January 2023 showed isolated PACs/PVCs.  Triggered events  associated with sinus rhythm and sinus tachycardia.  Patient reports increased palpitations described as racing and fluttering that occur around 3 am and wake her form sleep. Heart rate during episodes 80-90 bpm. Will trial low Toprol 12.5 mg at bedtime. She will decrease lisinopril (see #2).  Hypertension. BP today 104/72.  Patient denies headaches, dizziness or vision changes. With the addition of Toprol will decrease lisinopril to 2.5 mg daily.    Disposition: Toprol 12.5 mg at bedtime. Decrease lisinopril 2.5 mg daily. Return in 6 months with Dr. Servando Salina or sooner as needed.         Signed, Etta Grandchild. Heyden Jaber, DNP, NP-C

## 2022-11-22 ENCOUNTER — Encounter: Payer: Self-pay | Admitting: Student

## 2022-11-22 ENCOUNTER — Ambulatory Visit: Payer: Commercial Managed Care - PPO | Attending: Student | Admitting: Student

## 2022-11-22 VITALS — BP 104/72 | HR 68 | Ht 64.5 in | Wt 173.4 lb

## 2022-11-22 DIAGNOSIS — R002 Palpitations: Secondary | ICD-10-CM | POA: Diagnosis not present

## 2022-11-22 DIAGNOSIS — I10 Essential (primary) hypertension: Secondary | ICD-10-CM

## 2022-11-22 MED ORDER — LISINOPRIL 2.5 MG PO TABS: 2.5000 mg | ORAL_TABLET | Freq: Every day | ORAL | 3 refills | Status: DC

## 2022-11-22 MED ORDER — METOPROLOL SUCCINATE ER 25 MG PO TB24: 12.5000 mg | ORAL_TABLET | Freq: Every day | ORAL | 3 refills | Status: DC

## 2022-11-22 NOTE — Patient Instructions (Addendum)
Medication Instructions:  Your physician has recommended you make the following change in your medication:  DECREASE: Lisinopril 2.5mg  daily START: Toprol XL 12.5mg  at night  *If you need a refill on your cardiac medications before your next appointment, please call your pharmacy*   Lab Work: NONE If you have labs (blood work) drawn today and your tests are completely normal, you will receive your results only by: MyChart Message (if you have MyChart) OR A paper copy in the mail If you have any lab test that is abnormal or we need to change your treatment, we will call you to review the results.   Testing/Procedures: NONE   Follow-Up: At [facility], you and your health needs are our priority.  As part of our continuing mission to provide you with exceptional heart care, we have created designated Provider Care Teams.  These Care Teams include your primary Cardiologist (physician) and Advanced Practice Providers (APPs -  Physician Assistants and Nurse Practitioners) who all work together to provide you with the care you need, when you need it.  We recommend signing up for the patient portal called "MyChart".  Sign up information is provided on this After Visit Summary.  MyChart is used to connect with patients for Virtual Visits (Telemedicine).  Patients are able to view lab/test results, encounter notes, upcoming appointments, etc.  Non-urgent messages can be sent to your provider as well.   To learn more about what you can do with MyChart, go to [email or web address].    Your next appointment:   6 month(s)  Provider:   Nanetta Batty, MD

## 2023-01-07 ENCOUNTER — Other Ambulatory Visit: Payer: Self-pay | Admitting: Cardiovascular Disease

## 2023-05-19 IMAGING — DX DG CHEST 2V
2 series · 2 of 2 positions shown · non-contrast
Comparison: None.

CLINICAL DATA: Cardiac palpitations

EXAM:
CHEST - 2 VIEW

[chest pa]
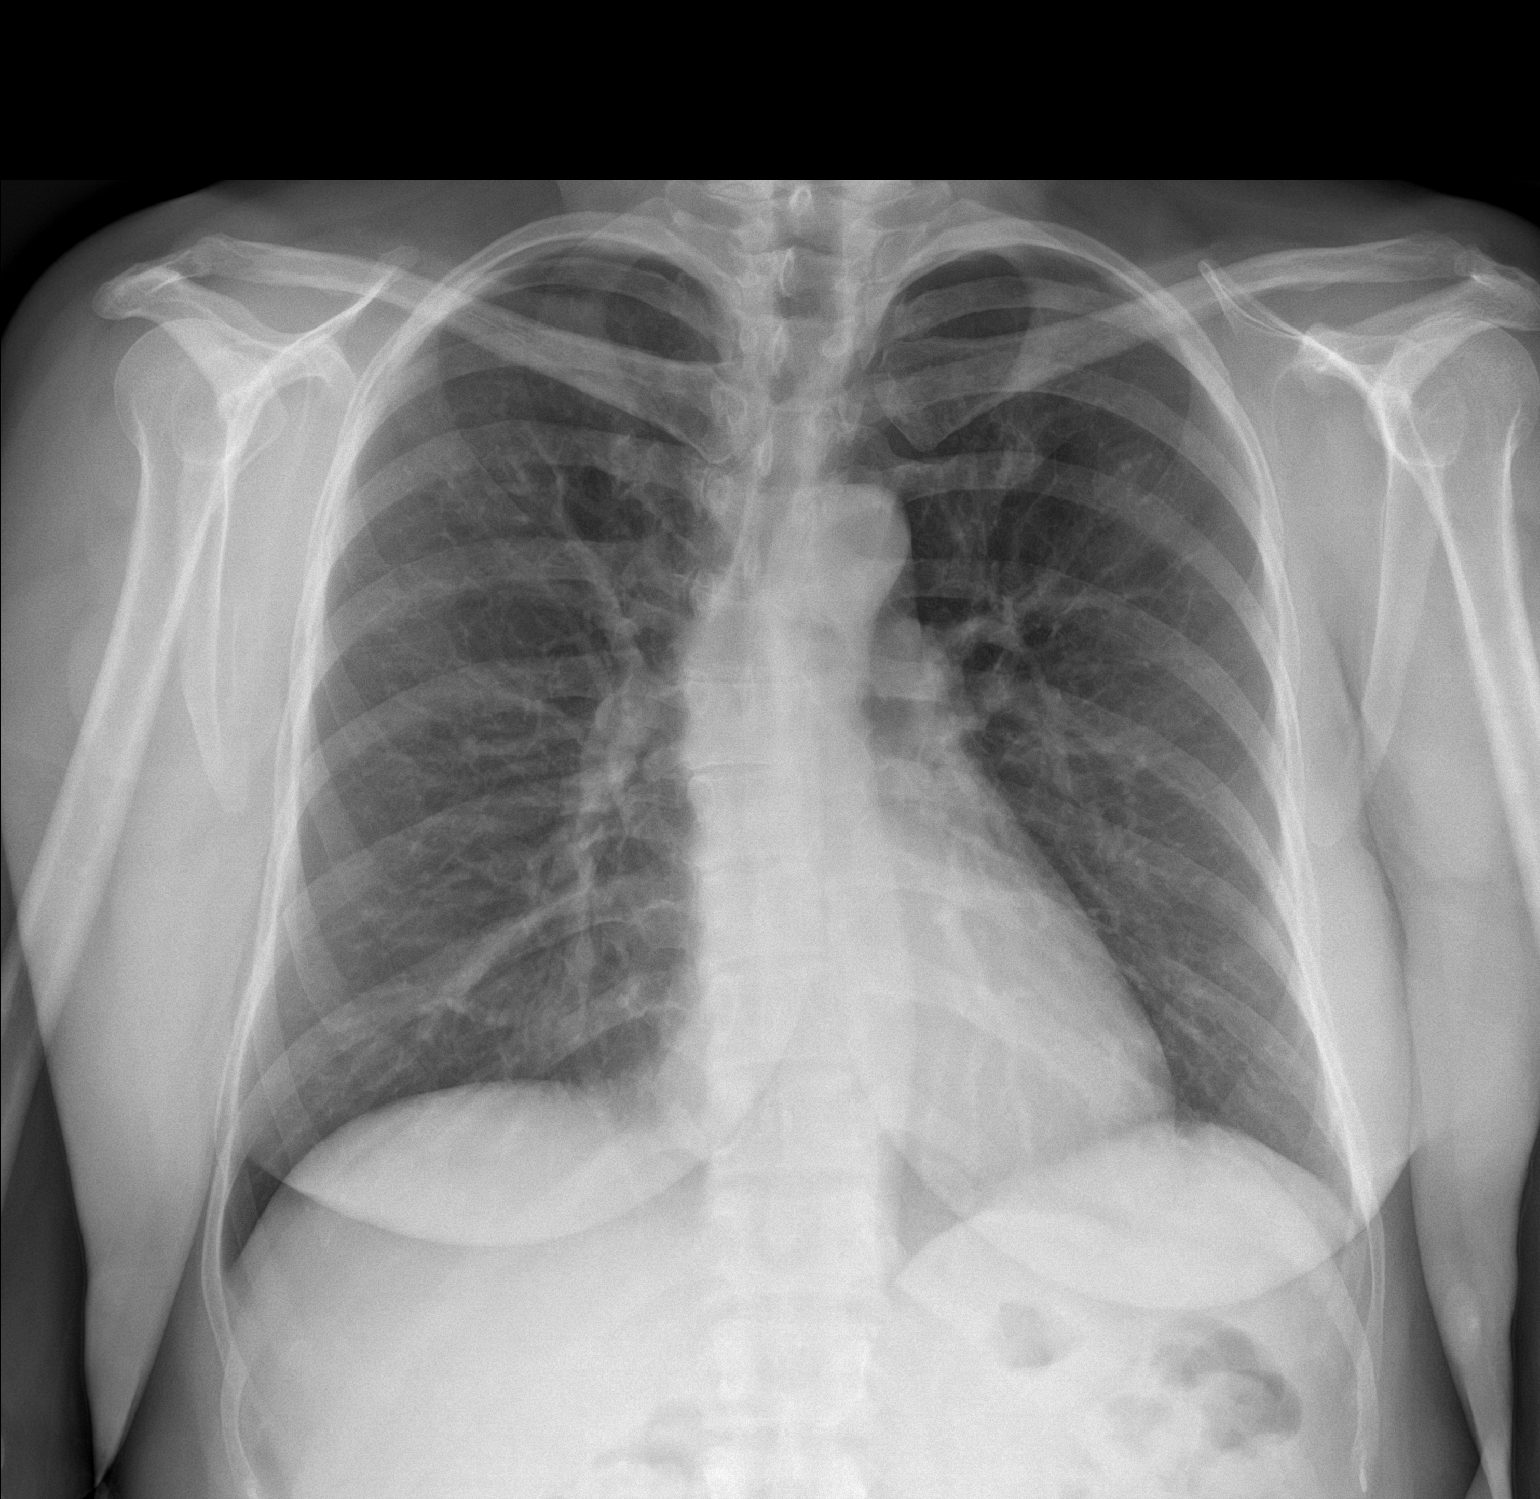

[chest lat]
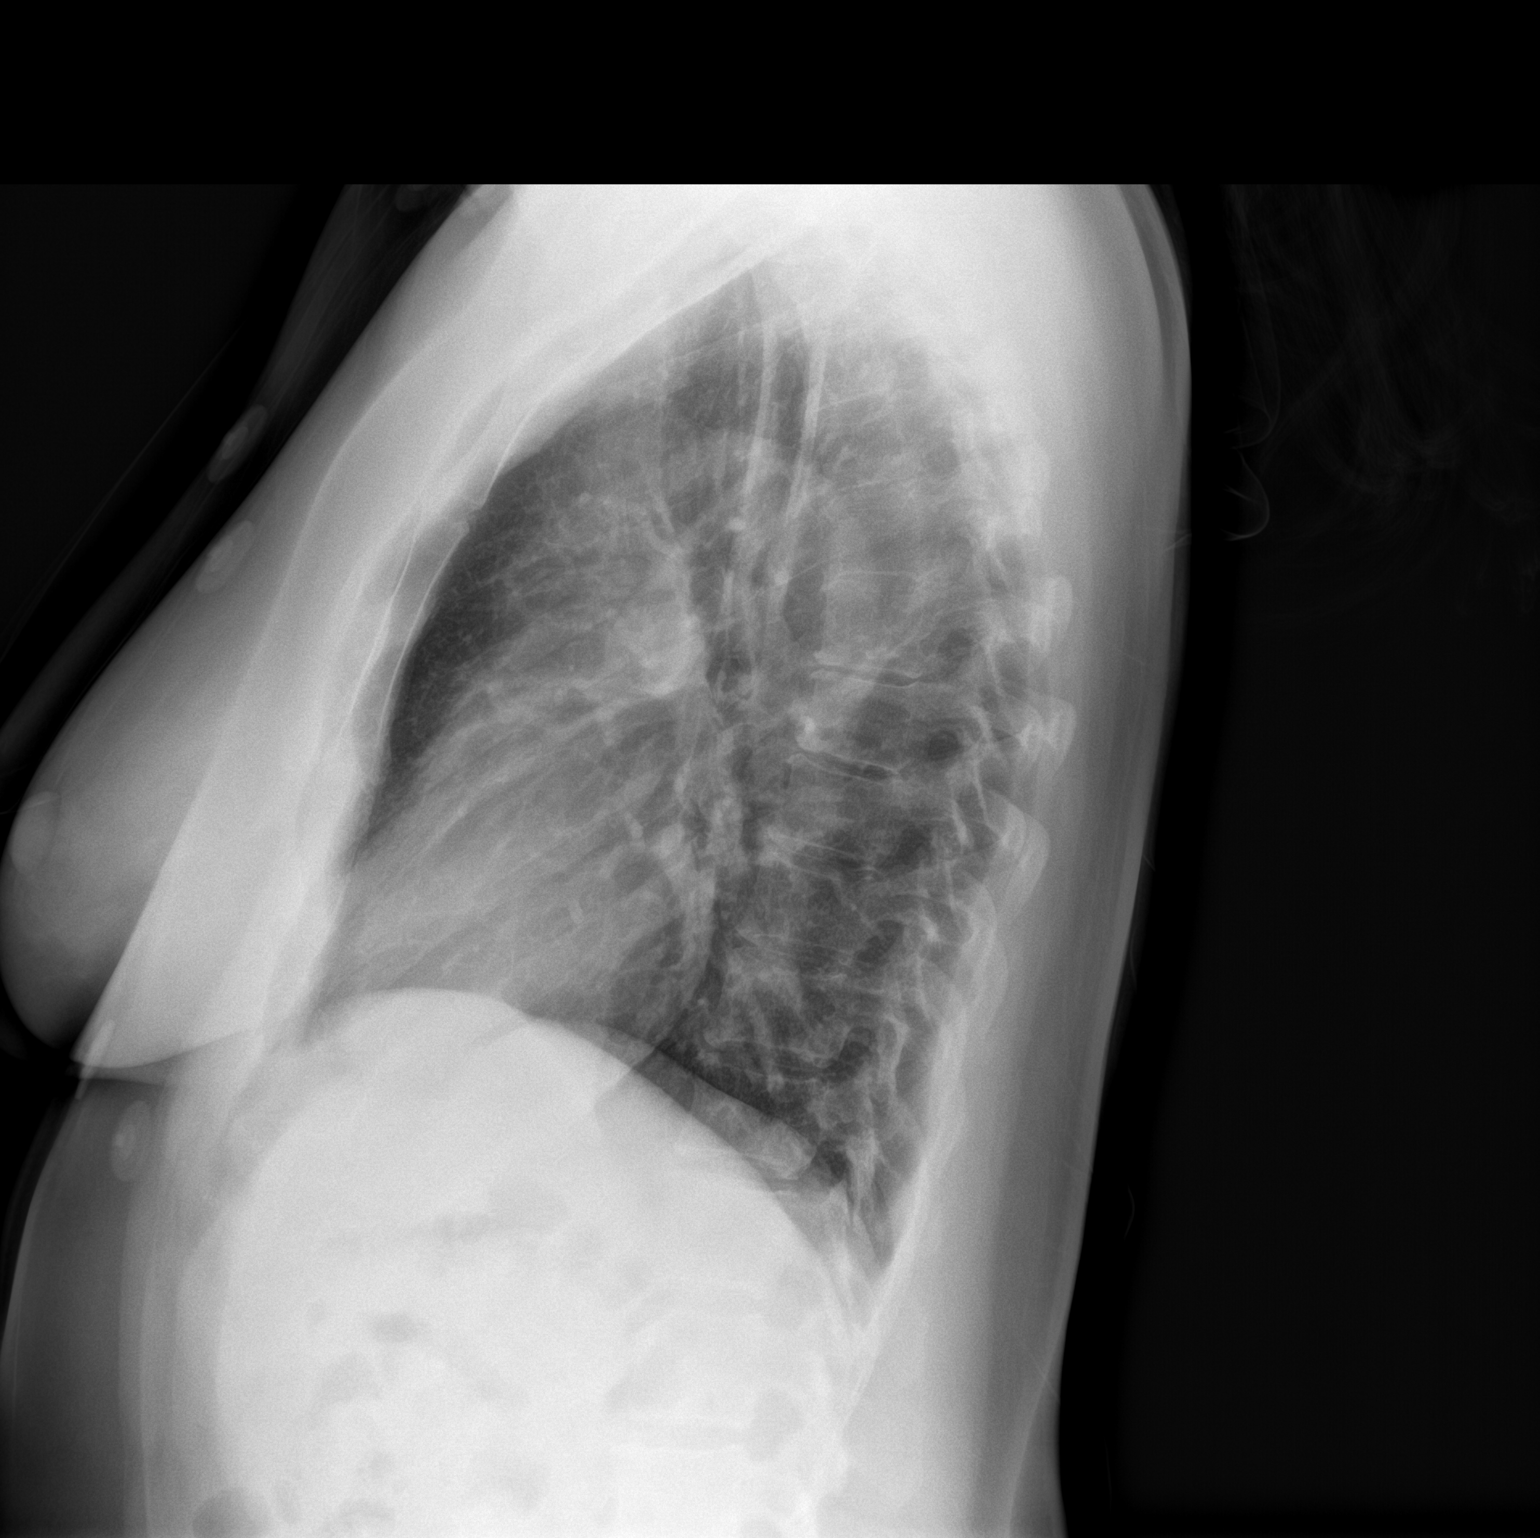

[2 of 2 positions shown; findings below may reference images not displayed]

FINDINGS: Lungs are clear. Heart size and pulmonary vascularity are normal. No
adenopathy. There is midthoracic dextroscoliosis.
IMPRESSION: Lungs clear.  Heart size normal.

## 2023-05-20 ENCOUNTER — Encounter: Payer: Self-pay | Admitting: Cardiovascular Disease

## 2023-05-21 ENCOUNTER — Ambulatory Visit: Payer: Commercial Managed Care - PPO | Admitting: Cardiovascular Disease

## 2023-12-25 ENCOUNTER — Ambulatory Visit: Attending: Cardiovascular Disease | Admitting: Cardiovascular Disease

## 2023-12-25 ENCOUNTER — Encounter: Payer: Self-pay | Admitting: Cardiovascular Disease

## 2023-12-25 VITALS — BP 112/74 | HR 81 | Ht 65.0 in | Wt 175.0 lb

## 2023-12-25 DIAGNOSIS — I493 Ventricular premature depolarization: Secondary | ICD-10-CM

## 2023-12-25 DIAGNOSIS — I10 Essential (primary) hypertension: Secondary | ICD-10-CM

## 2023-12-25 DIAGNOSIS — R002 Palpitations: Secondary | ICD-10-CM | POA: Diagnosis not present

## 2023-12-25 MED ORDER — METOPROLOL SUCCINATE ER 25 MG PO TB24: 12.5000 mg | ORAL_TABLET | Freq: Every day | ORAL | 4 refills | Status: AC

## 2023-12-25 NOTE — Addendum Note (Signed)
 Addended by: LORRENE FEDERICO CROME on: 12/25/2023 04:07 PM   Modules accepted: Orders

## 2023-12-25 NOTE — Patient Instructions (Signed)

## 2023-12-25 NOTE — Addendum Note (Signed)
 Addended by: LORRENE FEDERICO CROME on: 12/25/2023 04:05 PM   Modules accepted: Orders

## 2023-12-25 NOTE — Assessment & Plan Note (Signed)
 Patient has nocturnal palpitations which have been shown to be occasional PACs and PVCs.  She is on as needed nocturnal metoprolol  which seems to be effective.

## 2023-12-25 NOTE — Progress Notes (Signed)
 12/25/2023 Yolanda Hall   Aug 04, 1970  [id number]  Primary Physician Neysa Sensor, MD Primary Cardiologist: Dorn JINNY Lesches MD [facility] [place], [place]  HPI:  Yolanda Hall is a 53 y.o. moderately overweight married Hispanic female mother of 1 daughter (Yolanda Hall) who was previously a patient of Dr. Sheena  and is here to see me today because of palpitations.  She has no cardiac risk factors.  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  She has no cardiac risk factors except for mild hyperlipidemia.  She is never had a heart attack or stroke.  There is no family history of heart disease.  She did have a coronary calcium score of 0 and a 2D echo that was essentially normal.  She drinks occasional caffeine which she is very sensitive to.   Current Meds  Medication Sig   liothyronine (CYTOMEL) 5 MCG tablet Take 5 mcg by mouth daily. PT. TAKES 1/2 A DOSE (Patient taking differently: Take 7.5 mcg by mouth daily. PT. TAKES 1/2 A DOSE)   metFORMIN (GLUCOPHAGE-XR) 500 MG 24 hr tablet Take 500 mg by mouth daily with breakfast.   metoprolol  succinate (TOPROL  XL) 25 MG 24 hr tablet Take 0.5 tablets (12.5 mg total) by mouth at bedtime.   Multiple Vitamin (MULTIVITAMIN PO) Take by mouth. Vitamin D  , K , and A   NONFORMULARY OR COMPOUNDED ITEM Take 2 capsules by mouth at bedtime.     No Known Allergies  Social History   Socioeconomic History   Marital status: Married    Spouse name: Not on file   Number of children: Not on file   Years of education: Not on file   Highest education level: Not on file  Occupational History   Not on file  Tobacco Use   Smoking status: Never   Smokeless tobacco: Never  Substance and Sexual Activity   Alcohol use: Never   Drug use: Never   Sexual activity: Not on file  Other Topics Concern   Not on file  Social History Narrative   Not on file   Social Drivers of Health   Financial Resource Strain: Not on file   Food Insecurity: Not on file  Transportation Needs: Not on file  Physical Activity: Not on file  Stress: Not on file  Social Connections: Not on file  Intimate Partner Violence: Not on file     Review of Systems: General: negative for chills, fever, night sweats or weight changes.  Cardiovascular: negative for chest pain, dyspnea on exertion, edema, orthopnea, palpitations, paroxysmal nocturnal dyspnea or shortness of breath Dermatological: negative for rash Respiratory: negative for cough or wheezing Urologic: negative for hematuria Abdominal: negative for nausea, vomiting, diarrhea, bright red blood per rectum, melena, or hematemesis Neurologic: negative for visual changes, syncope, or dizziness All other systems reviewed and are otherwise negative except as noted above.    Blood pressure 112/74, pulse 81, height 5' 5 (1.651 m), weight 175 lb (79.4 kg), last menstrual period 10/10/2017, SpO2 100%.  General appearance: alert and no distress Neck: no adenopathy, no carotid bruit, no JVD, supple, symmetrical, trachea midline, and thyroid  not enlarged, symmetric, no tenderness/mass/nodules Lungs: clear to auscultation bilaterally Heart: regular rate and rhythm, S1, S2 normal, no murmur, click, rub or gallop Extremities: extremities normal, atraumatic, no cyanosis or edema Pulses: 2+ and symmetric Skin: Skin color, texture, turgor normal. No rashes or lesions Neurologic: Grossly normal  EKG EKG Interpretation Date/Time:  Tuesday December 25 2023 15:31:23 EDT Ventricular Rate:  81 PR Interval:  170 QRS Duration:  72 QT Interval:  360 QTC Calculation: 418 R Axis:   -56  Text Interpretation: Normal sinus rhythm Left axis deviation Inferior infarct , age undetermined When compared with ECG of 24-Nov-2020 11:29, Inferior infarct is now Present Confirmed by Court Carrier [phone]) on 12/25/2023 3:44:28 PM    ASSESSMENT AND PLAN:   PVC (premature ventricular contraction) Patient has  nocturnal palpitations which have been shown to be occasional PACs and PVCs.  She is on as needed nocturnal metoprolol  which seems to be effective.     Carrier DOROTHA Court MD FACP,FACC,FAHA, [facility] 12/25/2023 4:00 PM

## 2023-12-31 ENCOUNTER — Encounter: Payer: Self-pay | Admitting: Cardiovascular Disease

## 2024-04-02 ENCOUNTER — Telehealth: Payer: Self-pay | Admitting: General Practice

## 2024-04-02 NOTE — Telephone Encounter (Signed)
 Pt is requesting to Transfer from Yolanda Hall to [name]. Please advise

## 2024-04-06 NOTE — Telephone Encounter (Signed)
fine with me, thx.

## 2024-04-21 ENCOUNTER — Ambulatory Visit: Admitting: Family

## 2024-04-22 ENCOUNTER — Encounter: Payer: Self-pay | Admitting: Family

## 2024-04-22 NOTE — Telephone Encounter (Signed)
 Patient no showed TOC appt.  We are no longer able to schedule back at our office location.
